# Patient Record
Sex: Female | Born: 1975
Health system: Southern US, Community
[De-identification: ages and names within clinical notes are randomized; demographics above are authoritative.]

## PROBLEM LIST (undated history)

## (undated) DIAGNOSIS — I10 Essential (primary) hypertension: Secondary | ICD-10-CM

## (undated) DIAGNOSIS — M3500 Sicca syndrome, unspecified: Secondary | ICD-10-CM

## (undated) HISTORY — DX: Essential (primary) hypertension: I10

## (undated) HISTORY — DX: Sjogren syndrome, unspecified: M35.00

## (undated) HISTORY — PX: ABDOMINAL HYSTERECTOMY: SHX81

---

## 2019-07-25 ENCOUNTER — Ambulatory Visit: Payer: BC Managed Care – PPO | Admitting: Registered Nurse

## 2019-07-25 ENCOUNTER — Other Ambulatory Visit: Payer: Self-pay

## 2019-07-25 ENCOUNTER — Encounter: Payer: Self-pay | Admitting: Registered Nurse

## 2019-07-25 VITALS — BP 178/125 | HR 90 | Temp 98.1°F | Resp 17 | Ht 65.0 in | Wt 242.8 lb

## 2019-07-25 DIAGNOSIS — Z8669 Personal history of other diseases of the nervous system and sense organs: Secondary | ICD-10-CM

## 2019-07-25 DIAGNOSIS — Z130 Encounter for screening for diseases of the blood and blood-forming organs and certain disorders involving the immune mechanism: Secondary | ICD-10-CM

## 2019-07-25 DIAGNOSIS — Z13228 Encounter for screening for other metabolic disorders: Secondary | ICD-10-CM

## 2019-07-25 DIAGNOSIS — Z13220 Encounter for screening for lipoid disorders: Secondary | ICD-10-CM | POA: Diagnosis not present

## 2019-07-25 DIAGNOSIS — Z1329 Encounter for screening for other suspected endocrine disorder: Secondary | ICD-10-CM | POA: Diagnosis not present

## 2019-07-25 DIAGNOSIS — I10 Essential (primary) hypertension: Secondary | ICD-10-CM

## 2019-07-25 DIAGNOSIS — G8929 Other chronic pain: Secondary | ICD-10-CM

## 2019-07-25 DIAGNOSIS — R252 Cramp and spasm: Secondary | ICD-10-CM

## 2019-07-25 MED ORDER — LOSARTAN POTASSIUM 100 MG PO TABS: 100.0000 mg | ORAL_TABLET | Freq: Every day | ORAL | 0 refills | Status: DC

## 2019-07-25 MED ORDER — NEBIVOLOL HCL 10 MG PO TABS: 10.0000 mg | ORAL_TABLET | Freq: Every day | ORAL | 0 refills | Status: DC

## 2019-07-25 MED ORDER — CYCLOBENZAPRINE HCL 10 MG PO TABS: 10.0000 mg | ORAL_TABLET | Freq: Three times a day (TID) | ORAL | 0 refills | Status: DC | PRN

## 2019-07-25 MED ORDER — TOPIRAMATE 100 MG PO TABS: 100.0000 mg | ORAL_TABLET | Freq: Two times a day (BID) | ORAL | 0 refills | Status: DC

## 2019-07-25 MED ORDER — AMLODIPINE BESYLATE 5 MG PO TABS: 5.0000 mg | ORAL_TABLET | Freq: Every day | ORAL | 0 refills | Status: DC

## 2019-07-25 MED ORDER — GABAPENTIN 300 MG PO CAPS: 300.0000 mg | ORAL_CAPSULE | Freq: Three times a day (TID) | ORAL | 0 refills | Status: DC

## 2019-07-25 NOTE — Patient Instructions (Addendum)
Ms Adelma Bowdoin to meet you and your husband today. I'm looking forward to working with you.  I have refilled your gabapentin, flexeril, and topiramate. I'm hoping these will help with your pain.   For your blood pressure:  Start losartan this week.  Start amlodipine next week.  Start nebivolol the week after.   We have collected the following labs today: CBC (blood counts), CMP (liver and kidney function, electrolytes), TSH (thyroid), A1c (sugars), and Lipid panel (cholesterol), as well as checking Vitamin D, B12, and Magnesium levels. I'll let you know about these results as soon as I can.    I would like to see you again in about 3 weeks to double check on your blood pressure and pain. Please let me know if you have any concerns in the mean time.  Thank you,   Jari Sportsman, NP    If you have lab work done today you will be contacted with your lab results within the next 2 weeks.  If you have not heard from [place] then please contact us. The fastest way to get your results is to register for My Chart.   IF you received an x-ray today, you will receive an invoice from [facility], [facility], [facility]. Please contact [facility] at [phone] with questions or concerns regarding your invoice.   IF you received labwork today, you will receive an invoice from [place]. Please contact [facility] at [phone] with questions or concerns regarding your invoice.   Our billing staff will not be able to assist you with questions regarding bills from these companies.  You will be contacted with the lab results as soon as they are available. The fastest way to get your results is to activate your My Chart account. Instructions are located on the last page of this paperwork. If you have not heard from [place] regarding the results in 2 weeks, please contact this office.

## 2019-07-26 LAB — CBC WITH DIFFERENTIAL/PLATELET
Basophils Absolute: 0 10*3/uL (ref 0.0–0.2)
Basos: 1 %
EOS (ABSOLUTE): 0.1 10*3/uL (ref 0.0–0.4)
Eos: 1 %
Hematocrit: 41.8 % (ref 34.0–46.6)
Hemoglobin: 13.5 g/dL (ref 11.1–15.9)
Immature Grans (Abs): 0 10*3/uL (ref 0.0–0.1)
Immature Granulocytes: 0 %
Lymphocytes Absolute: 1.8 10*3/uL (ref 0.7–3.1)
Lymphs: 25 %
MCH: 27.9 pg (ref 26.6–33.0)
MCHC: 32.3 g/dL (ref 31.5–35.7)
MCV: 86 fL (ref 79–97)
Monocytes Absolute: 0.3 10*3/uL (ref 0.1–0.9)
Monocytes: 4 %
Neutrophils Absolute: 5 10*3/uL (ref 1.4–7.0)
Neutrophils: 69 %
Platelets: 265 10*3/uL (ref 150–450)
RBC: 4.84 x10E6/uL (ref 3.77–5.28)
RDW: 13.7 % (ref 11.7–15.4)
WBC: 7.3 10*3/uL (ref 3.4–10.8)

## 2019-07-26 LAB — COMPREHENSIVE METABOLIC PANEL
ALT: 80 IU/L — ABNORMAL HIGH (ref 0–32)
AST: 62 IU/L — ABNORMAL HIGH (ref 0–40)
Albumin/Globulin Ratio: 1.3 (ref 1.2–2.2)
Albumin: 4.2 g/dL (ref 3.8–4.8)
Alkaline Phosphatase: 85 IU/L (ref 48–121)
BUN/Creatinine Ratio: 10 (ref 9–23)
BUN: 9 mg/dL (ref 6–24)
Bilirubin Total: 0.6 mg/dL (ref 0.0–1.2)
CO2: 25 mmol/L (ref 20–29)
Calcium: 9 mg/dL (ref 8.7–10.2)
Chloride: 100 mmol/L (ref 96–106)
Creatinine, Ser: 0.93 mg/dL (ref 0.57–1.00)
GFR calc Af Amer: 87 mL/min/{1.73_m2} (ref >59)
GFR calc non Af Amer: 76 mL/min/{1.73_m2} (ref >59)
Globulin, Total: 3.2 g/dL (ref 1.5–4.5)
Glucose: 96 mg/dL (ref 65–99)
Potassium: 4.3 mmol/L (ref 3.5–5.2)
Sodium: 138 mmol/L (ref 134–144)
Total Protein: 7.4 g/dL (ref 6.0–8.5)

## 2019-07-26 LAB — LIPID PANEL
Chol/HDL Ratio: 3.4 ratio (ref 0.0–4.4)
Cholesterol, Total: 155 mg/dL (ref 100–199)
HDL: 46 mg/dL (ref >39)
LDL Chol Calc (NIH): 83 mg/dL (ref 0–99)
Triglycerides: 151 mg/dL — ABNORMAL HIGH (ref 0–149)
VLDL Cholesterol Cal: 26 mg/dL (ref 5–40)

## 2019-07-26 LAB — VITAMIN B12: Vitamin B-12: 470 pg/mL (ref 232–1245)

## 2019-07-26 LAB — HEMOGLOBIN A1C
Est. average glucose Bld gHb Est-mCnc: 128 mg/dL
Hgb A1c MFr Bld: 6.1 % — ABNORMAL HIGH (ref 4.8–5.6)

## 2019-07-26 LAB — VITAMIN D 25 HYDROXY (VIT D DEFICIENCY, FRACTURES): Vit D, 25-Hydroxy: 10.5 ng/mL — ABNORMAL LOW (ref 30.0–100.0)

## 2019-07-26 LAB — TSH: TSH: 1.84 u[IU]/mL (ref 0.450–4.500)

## 2019-07-26 LAB — MAGNESIUM: Magnesium: 1.9 mg/dL (ref 1.6–2.3)

## 2019-08-05 ENCOUNTER — Other Ambulatory Visit: Payer: Self-pay | Admitting: Registered Nurse

## 2019-08-05 DIAGNOSIS — E559 Vitamin D deficiency, unspecified: Secondary | ICD-10-CM | POA: Insufficient documentation

## 2019-08-05 MED ORDER — VITAMIN D (ERGOCALCIFEROL) 1.25 MG (50000 UNIT) PO CAPS: 50000.0000 [IU] | ORAL_CAPSULE | ORAL | 0 refills | Status: DC

## 2019-08-05 NOTE — Progress Notes (Signed)
Ms Butkiewicz needs to start on a Vitamin D supplement of 50,000 units once weekly for the next 8 weeks. Let's recheck levels after that. I'm sure this is contributing to her symptoms.  If we could call her to let her know, that would be awesome.  Thanks,   Jari Sportsman, NP

## 2019-08-06 ENCOUNTER — Telehealth: Payer: Self-pay | Admitting: Registered Nurse

## 2019-08-06 NOTE — Telephone Encounter (Signed)
Attempted to call, R. Kateri Plummer does not recommend any specific B-12 however I do not see where she was recommended B-12 I do see where Kateri Plummer ordered high dose Vitamin D 50, 000 Units once weekly. Please clarify and ensure that this is not what they are talking about or transfer to myself.

## 2019-08-06 NOTE — Telephone Encounter (Signed)
Pt husband called asking what kind of b12 pt should buy. Pt would like a call to help assist. Please advise.

## 2019-08-07 ENCOUNTER — Telehealth: Payer: Self-pay | Admitting: Registered Nurse

## 2019-08-07 NOTE — Telephone Encounter (Signed)
Left message with patient's husband who is listed on HIPPA form ,clarifications for Vitamin levels and drs lab result notes.

## 2019-08-07 NOTE — Telephone Encounter (Signed)
Pt is calling back needing clarification if B-12 is needed if was ordered does she need it.

## 2019-08-07 NOTE — Telephone Encounter (Signed)
LVM for patient to call back to make she has clarification on drs recommendations regarding vitamins.

## 2019-08-15 ENCOUNTER — Ambulatory Visit: Payer: BC Managed Care – PPO | Admitting: Registered Nurse

## 2019-08-17 ENCOUNTER — Ambulatory Visit: Payer: BC Managed Care – PPO | Admitting: Registered Nurse

## 2019-08-17 ENCOUNTER — Encounter: Payer: Self-pay | Admitting: Registered Nurse

## 2019-08-17 ENCOUNTER — Other Ambulatory Visit: Payer: Self-pay

## 2019-08-17 VITALS — BP 156/99 | HR 98 | Temp 98.1°F | Ht 65.0 in | Wt 245.0 lb

## 2019-08-17 DIAGNOSIS — G8929 Other chronic pain: Secondary | ICD-10-CM

## 2019-08-17 DIAGNOSIS — I10 Essential (primary) hypertension: Secondary | ICD-10-CM | POA: Diagnosis not present

## 2019-08-17 MED ORDER — METOPROLOL SUCCINATE ER 50 MG PO TB24: 50.0000 mg | ORAL_TABLET | Freq: Every day | ORAL | 3 refills | Status: AC

## 2019-08-17 MED ORDER — PREDNISONE 20 MG PO TABS: 20.0000 mg | ORAL_TABLET | Freq: Every day | ORAL | 0 refills | Status: DC

## 2019-08-17 NOTE — Patient Instructions (Signed)
° ° ° °  If you have lab work done today you will be contacted with your lab results within the next 2 weeks.  If you have not heard from us then please contact us. The fastest way to get your results is to register for My Chart. ° ° °IF you received an x-ray today, you will receive an invoice from [facility]. Please contact [facility] at [phone] with questions or concerns regarding your invoice.  ° °IF you received labwork today, you will receive an invoice from [facility]. Please contact [facility] at [phone] with questions or concerns regarding your invoice.  ° °Our billing staff will not be able to assist you with questions regarding bills from these companies. ° °You will be contacted with the lab results as soon as they are available. The fastest way to get your results is to activate your My Chart account. Instructions are located on the last page of this paperwork. If you have not heard from us regarding the results in 2 weeks, please contact this office. °  ° ° ° °

## 2019-08-18 LAB — SEDIMENTATION RATE: Sed Rate: 26 mm/hr (ref 0–32)

## 2019-08-18 LAB — C-REACTIVE PROTEIN: CRP: 7 mg/L (ref 0–10)

## 2019-08-18 LAB — ANA: Anti Nuclear Antibody (ANA): POSITIVE — AB

## 2019-08-19 ENCOUNTER — Other Ambulatory Visit: Payer: Self-pay | Admitting: Registered Nurse

## 2019-08-19 DIAGNOSIS — R768 Other specified abnormal immunological findings in serum: Secondary | ICD-10-CM

## 2019-08-19 DIAGNOSIS — G8929 Other chronic pain: Secondary | ICD-10-CM

## 2019-08-21 ENCOUNTER — Other Ambulatory Visit: Payer: Self-pay | Admitting: Registered Nurse

## 2019-08-21 DIAGNOSIS — G8929 Other chronic pain: Secondary | ICD-10-CM

## 2019-08-21 DIAGNOSIS — Z8669 Personal history of other diseases of the nervous system and sense organs: Secondary | ICD-10-CM

## 2019-08-21 DIAGNOSIS — I10 Essential (primary) hypertension: Secondary | ICD-10-CM

## 2019-08-21 NOTE — Telephone Encounter (Signed)
Patient is requesting to 90 day supply on this medication is this ok to do?

## 2019-08-23 ENCOUNTER — Telehealth: Payer: Self-pay | Admitting: Registered Nurse

## 2019-08-23 NOTE — Telephone Encounter (Signed)
Patient husband wanted a clearer understanding of msg patient's wife received. Patient husband was given the same msg that Kateri Plummer had stated for [place] to given. Patient husband wanted to know what do it mean to have a positive ANA. He was informed that its inflammation in patient' body where her body is fighting against her own body tissue. SO we put a referral in to the Rheumatologist so they can run further tests. It could be lupus, it could be Rheumatoid arthritis but we have no way of knowing what is her body fighting against and that why we want to send her to a specialist.

## 2019-08-23 NOTE — Telephone Encounter (Signed)
7/29-2021 - PATIENT'S HUSBAND (LACY COLON) CALLED TO SAY BRIAN CALLED HIS WIFE ON Monday 08/20/19 TO GIVE HER SOME LAB RESULTS FROM [facility]. SHE DID NOT UNDERSTAND WHAT HE WAS SAYING THAT SHE WAS POSITIVE FOR? HE WOULD LIKE SOMEONE TO EXPLAIN IT TO HIM. MR. COLON IS ON THE PATIENT'S HIPAA FOR 2021. BEST PHONE [phone] (CELL) MBC

## 2019-08-28 ENCOUNTER — Other Ambulatory Visit: Payer: Self-pay | Admitting: Emergency Medicine

## 2019-08-28 ENCOUNTER — Other Ambulatory Visit: Payer: Self-pay | Admitting: Registered Nurse

## 2019-08-28 DIAGNOSIS — E559 Vitamin D deficiency, unspecified: Secondary | ICD-10-CM

## 2019-08-28 DIAGNOSIS — I10 Essential (primary) hypertension: Secondary | ICD-10-CM

## 2019-10-21 ENCOUNTER — Encounter: Payer: Self-pay | Admitting: Registered Nurse

## 2019-10-21 NOTE — Progress Notes (Signed)
New Patient Office Visit  Subjective:  Patient ID: Sheila Duran, female    DOB: 1975/04/18  Age: 44 y.o. MRN: [id number]  CC:  Chief Complaint  Patient presents with   New Patient (Initial Visit)    patient states she has been having pain on her whole right side including her back for a year but since last week it has got servere. Per patient she has not taken anything for pain   Hypertension    178/125    HPI Sheila Duran presents for pain  R side including R arm, leg, and torso. Sharp pain. Waxes and wanes. No injury, no hx of this pain. OTCs not effective.   Also notes hx of htn. bp very elevated today. Denies CV symptoms. Feeling well overall. Has been medicated in the past, but not currently on medications  Past Medical History:  Diagnosis Date   Hypertension     Past Surgical History:  Procedure Laterality Date   ABDOMINAL HYSTERECTOMY     CESAREAN SECTION      Family History  Problem Relation Age of Onset   Diabetes Mother    Hypertension Mother    Kidney failure Mother    Hypertension Father    Healthy Sister    Healthy Brother    Healthy Daughter    Healthy Son     Social History   Socioeconomic History   Marital status: Married    Spouse name: Not on file   Number of children: 3   Years of education: Not on file   Highest education level: Not on file  Occupational History   Not on file  Tobacco Use   Smoking status: Never Smoker   Smokeless tobacco: Never Used  Building services engineer Use: Never used  Substance and Sexual Activity   Alcohol use: Yes    Comment: rare   Drug use: Never   Sexual activity: Yes  Other Topics Concern   Not on file  Social History Narrative   Not on file   Social Determinants of Health   Financial Resource Strain:    Difficulty of Paying Living Expenses: Not on file  Food Insecurity:    Worried About Programme researcher, broadcasting/film/video in the Last Year: Not on file   [employer] of Food in the Last  Year: Not on file  Transportation Needs:    Lack of Transportation (Medical): Not on file   Lack of Transportation (Non-Medical): Not on file  Physical Activity:    Days of Exercise per Week: Not on file   Minutes of Exercise per Session: Not on file  Stress:    Feeling of Stress : Not on file  Social Connections:    Frequency of Communication with Friends and Family: Not on file   Frequency of Social Gatherings with Friends and Family: Not on file   Attends Religious Services: Not on file   Active Member of Clubs or Organizations: Not on file   Attends Banker Meetings: Not on file   Marital Status: Not on file  Intimate Partner Violence:    Fear of Current or Ex-Partner: Not on file   Emotionally Abused: Not on file   Physically Abused: Not on file   Sexually Abused: Not on file    ROS Review of Systems  Constitutional: Negative.   HENT: Negative.   Eyes: Negative.   Respiratory: Negative.   Cardiovascular: Negative.   Gastrointestinal: Negative.   Genitourinary: Negative.   Musculoskeletal:  Negative.   Skin: Negative.   Neurological: Negative.   Psychiatric/Behavioral: Negative.     Objective:   Today's Vitals: BP (!) 178/125    Pulse 90    Temp 98.1 F (36.7 C) (Temporal)    Resp 17    Ht 5\' 5"  (1.651 m)    Wt 242 lb 12.8 oz (110.1 kg)    SpO2 97%    BMI 40.40 kg/m   Physical Exam Vitals and nursing note reviewed.  Constitutional:      General: She is not in acute distress.    Appearance: Normal appearance. She is obese. She is not ill-appearing, toxic-appearing or diaphoretic.  Cardiovascular:     Pulses: Normal pulses.     Heart sounds: Normal heart sounds. No murmur heard.  No friction rub. No gallop.   Pulmonary:     Effort: Pulmonary effort is normal. No respiratory distress.     Breath sounds: Normal breath sounds. No stridor. No wheezing, rhonchi or rales.  Chest:     Chest wall: No tenderness.  Skin:    Capillary  Refill: Capillary refill takes less than 2 seconds.  Neurological:     General: No focal deficit present.     Mental Status: She is alert and oriented to person, place, and time. Mental status is at baseline.  Psychiatric:        Mood and Affect: Mood normal.        Behavior: Behavior normal.        Thought Content: Thought content normal.        Judgment: Judgment normal.     Assessment & Plan:   Problem List Items Addressed This Visit    None    Visit Diagnoses    Other chronic pain    -  Primary   Relevant Medications   nabumetone (RELAFEN) 500 MG tablet   cyclobenzaprine (FLEXERIL) 10 MG tablet   Other Relevant Orders   Vitamin D, 25-hydroxy (Completed)   Vitamin B12 (Completed)   Magnesium (Completed)   Cramp of limb       Relevant Orders   Vitamin D, 25-hydroxy (Completed)   Vitamin B12 (Completed)   Magnesium (Completed)   Screening for endocrine, metabolic and immunity disorder       Relevant Orders   TSH (Completed)   Comprehensive metabolic panel (Completed)   CBC with Differential (Completed)   Hemoglobin A1c (Completed)   Lipid screening       Relevant Orders   Lipid panel (Completed)   Hx of migraine headaches       Essential hypertension       Relevant Medications   spironolactone (ALDACTONE) 100 MG tablet   nebivolol (BYSTOLIC) 10 MG tablet      Outpatient Encounter Medications as of 07/25/2019  Medication Sig   cyclobenzaprine (FLEXERIL) 10 MG tablet Take 1 tablet (10 mg total) by mouth 3 (three) times daily as needed for muscle spasms.   Iron-Vitamin C 65-125 MG TABS Take by mouth.   nabumetone (RELAFEN) 500 MG tablet Take 500 mg by mouth daily. (Patient not taking: Reported on 08/17/2019)   nebivolol (BYSTOLIC) 10 MG tablet Take 1 tablet (10 mg total) by mouth daily. (Patient not taking: Reported on 08/17/2019)   spironolactone (ALDACTONE) 100 MG tablet Take 100 mg by mouth daily. (Patient not taking: Reported on 08/17/2019)   [DISCONTINUED]  amLODipine (NORVASC) 5 MG tablet Take 5 mg by mouth daily.   [DISCONTINUED] amLODipine (NORVASC) 5 MG tablet Take 1 tablet (  5 mg total) by mouth daily.   [DISCONTINUED] cyclobenzaprine (FLEXERIL) 10 MG tablet Take 10 mg by mouth 3 (three) times daily as needed for muscle spasms.   [DISCONTINUED] gabapentin (NEURONTIN) 300 MG capsule Take 300 mg by mouth 3 (three) times daily.   [DISCONTINUED] gabapentin (NEURONTIN) 300 MG capsule Take 1 capsule (300 mg total) by mouth 3 (three) times daily.   [DISCONTINUED] losartan (COZAAR) 100 MG tablet Take 100 mg by mouth daily.   [DISCONTINUED] losartan (COZAAR) 100 MG tablet Take 1 tablet (100 mg total) by mouth daily.   [DISCONTINUED] nebivolol (BYSTOLIC) 10 MG tablet Take 10 mg by mouth daily.   [DISCONTINUED] topiramate (TOPAMAX) 100 MG tablet Take 100 mg by mouth 2 (two) times daily.   [DISCONTINUED] topiramate (TOPAMAX) 100 MG tablet Take 1 tablet (100 mg total) by mouth 2 (two) times daily.   No facility-administered encounter medications on file as of 07/25/2019.    Follow-up: No follow-ups on file.   PLAN  relafen for pain.  Labs collected, will follow up as warranted  Will start losartan 100mg  PO qd, in one week add nebivolol 10mg  PO qd, return in 2 weeks for nurse visit bp check  Will follow up based on bp and pain experience going forward  Patient encouraged to call clinic with any questions, comments, or concerns.  , NP

## 2019-10-23 ENCOUNTER — Encounter: Payer: Self-pay | Admitting: Registered Nurse

## 2019-10-23 NOTE — Progress Notes (Signed)
Established Patient Office Visit  Subjective:  Patient ID: Sheila Duran, female    DOB: 1975/05/02  Age: 44 y.o. MRN: [id number]  CC:  Chief Complaint  Patient presents with  . right hip and leg pain    folow up pain not better   . Medication Management    cannot pidk up nibivolol , $100    HPI [name] presents for ongoing pain Could not afford nebivolol  Unfortunately no improvement in pain despite attempted treatment.  No change in quality No new symptoms  Past Medical History:  Diagnosis Date  . Hypertension     Past Surgical History:  Procedure Laterality Date  . ABDOMINAL HYSTERECTOMY    . CESAREAN SECTION      Family History  Problem Relation Age of Onset  . Diabetes Mother   . Hypertension Mother   . Kidney failure Mother   . Hypertension Father   . Healthy Sister   . Healthy Brother   . Healthy Daughter   . Healthy Son     Social History   Socioeconomic History  . Marital status: Married    Spouse name: Not on file  . Number of children: 3  . Years of education: Not on file  . Highest education level: Not on file  Occupational History  . Not on file  Tobacco Use  . Smoking status: Never Smoker  . Smokeless tobacco: Never Used  Vaping Use  . Vaping Use: Never used  Substance and Sexual Activity  . Alcohol use: Yes    Comment: rare  . Drug use: Never  . Sexual activity: Yes  Other Topics Concern  . Not on file  Social History Narrative  . Not on file   Social Determinants of Health   Financial Resource Strain:   . Difficulty of Paying Living Expenses: Not on file  Food Insecurity:   . Worried About Charity fundraiser in the Last Year: Not on file  . Ran Out of Food in the Last Year: Not on file  Transportation Needs:   . Lack of Transportation (Medical): Not on file  . Lack of Transportation (Non-Medical): Not on file  Physical Activity:   . Days of Exercise per Week: Not on file  . Minutes of Exercise per Session: Not  on file  Stress:   . Feeling of Stress : Not on file  Social Connections:   . Frequency of Communication with Friends and Family: Not on file  . Frequency of Social Gatherings with Friends and Family: Not on file  . Attends Religious Services: Not on file  . Active Member of Clubs or Organizations: Not on file  . Attends Archivist Meetings: Not on file  . Marital Status: Not on file  Intimate Partner Violence:   . Fear of Current or Ex-Partner: Not on file  . Emotionally Abused: Not on file  . Physically Abused: Not on file  . Sexually Abused: Not on file    Outpatient Medications Prior to Visit  Medication Sig Dispense Refill  . cyclobenzaprine (FLEXERIL) 10 MG tablet Take 1 tablet (10 mg total) by mouth 3 (three) times daily as needed for muscle spasms. 90 tablet 0  . Iron-Vitamin C 65-125 MG TABS Take by mouth.    Marland Kitchen amLODipine (NORVASC) 5 MG tablet Take 1 tablet (5 mg total) by mouth daily. 90 tablet 0  . gabapentin (NEURONTIN) 300 MG capsule Take 1 capsule (300 mg total) by mouth 3 (three) times  daily. 270 capsule 0  . losartan (COZAAR) 100 MG tablet Take 1 tablet (100 mg total) by mouth daily. 90 tablet 0  . topiramate (TOPAMAX) 100 MG tablet Take 1 tablet (100 mg total) by mouth 2 (two) times daily. 180 tablet 0  . Vitamin D, Ergocalciferol, (DRISDOL) 1.25 MG (50000 UNIT) CAPS capsule Take 1 capsule (50,000 Units total) by mouth every 7 (seven) days. 8 capsule 0  . nabumetone (RELAFEN) 500 MG tablet Take 500 mg by mouth daily. (Patient not taking: Reported on 08/17/2019)    . nebivolol (BYSTOLIC) 10 MG tablet Take 1 tablet (10 mg total) by mouth daily. (Patient not taking: Reported on 08/17/2019) 90 tablet 0  . spironolactone (ALDACTONE) 100 MG tablet Take 100 mg by mouth daily. (Patient not taking: Reported on 08/17/2019)     No facility-administered medications prior to visit.    Allergies  Allergen Reactions  . Lisinopril-Hydrochlorothiazide Cough    And chest  pain   . Amoxicillin Rash    ROS Review of Systems  Constitutional: Negative.   HENT: Negative.   Eyes: Negative.   Respiratory: Negative.   Cardiovascular: Negative.   Gastrointestinal: Negative.   Genitourinary: Negative.   Musculoskeletal: Negative.   Skin: Negative.   Neurological: Negative.   Psychiatric/Behavioral: Negative.       Objective:    Physical Exam Vitals and nursing note reviewed.  Constitutional:      General: She is not in acute distress.    Appearance: Normal appearance. She is normal weight. She is not ill-appearing, toxic-appearing or diaphoretic.  Cardiovascular:     Rate and Rhythm: Normal rate and regular rhythm.     Heart sounds: Normal heart sounds. No murmur heard.  No friction rub. No gallop.   Pulmonary:     Effort: Pulmonary effort is normal. No respiratory distress.     Breath sounds: Normal breath sounds. No stridor. No wheezing, rhonchi or rales.  Chest:     Chest wall: No tenderness.  Skin:    General: Skin is warm and dry.  Neurological:     General: No focal deficit present.     Mental Status: She is alert and oriented to person, place, and time. Mental status is at baseline.  Psychiatric:        Mood and Affect: Mood normal.        Behavior: Behavior normal.        Thought Content: Thought content normal.        Judgment: Judgment normal.     BP (!) 156/99   Pulse 98   Temp 98.1 F (36.7 C)   Ht '5\' 5"'  (1.651 m)   Wt (!) 245 lb (111.1 kg)   SpO2 100%   BMI 40.77 kg/m  Wt Readings from Last 3 Encounters:  08/17/19 (!) 245 lb (111.1 kg)  07/25/19 242 lb 12.8 oz (110.1 kg)     Health Maintenance Due  Topic Date Due  . COVID-19 Vaccine (1) Never done  . PAP SMEAR-Modifier  Never done  . INFLUENZA VACCINE  Never done    There are no preventive care reminders to display for this patient.  Lab Results  Component Value Date   TSH 1.840 07/25/2019   Lab Results  Component Value Date   WBC 7.3 07/25/2019   HGB  13.5 07/25/2019   HCT 41.8 07/25/2019   MCV 86 07/25/2019   PLT 265 07/25/2019   Lab Results  Component Value Date   NA 138 07/25/2019  K 4.3 07/25/2019   CO2 25 07/25/2019   GLUCOSE 96 07/25/2019   BUN 9 07/25/2019   CREATININE 0.93 07/25/2019   BILITOT 0.6 07/25/2019   ALKPHOS 85 07/25/2019   AST 62 (H) 07/25/2019   ALT 80 (H) 07/25/2019   PROT 7.4 07/25/2019   ALBUMIN 4.2 07/25/2019   CALCIUM 9.0 07/25/2019   Lab Results  Component Value Date   CHOL 155 07/25/2019   Lab Results  Component Value Date   HDL 46 07/25/2019   Lab Results  Component Value Date   LDLCALC 83 07/25/2019   Lab Results  Component Value Date   TRIG 151 (H) 07/25/2019   Lab Results  Component Value Date   CHOLHDL 3.4 07/25/2019   Lab Results  Component Value Date   HGBA1C 6.1 (H) 07/25/2019      Assessment & Plan:   Problem List Items Addressed This Visit    None    Visit Diagnoses    Essential hypertension    -  Primary   Relevant Medications   metoprolol succinate (TOPROL-XL) 50 MG 24 hr tablet   Other chronic pain       Relevant Medications   predniSONE (DELTASONE) 20 MG tablet   Other Relevant Orders   ANA (Completed)   Sedimentation Rate (Completed)   C-reactive protein (Completed)      Meds ordered this encounter  Medications  . metoprolol succinate (TOPROL-XL) 50 MG 24 hr tablet    Sig: Take 1 tablet (50 mg total) by mouth daily. Take with or immediately following a meal.    Dispense:  90 tablet    Refill:  3    Order Specific Question:   Supervising Provider    Answer:   Carlota Raspberry, JEFFREY R [[id number]]  . predniSONE (DELTASONE) 20 MG tablet    Sig: Take 1 tablet (20 mg total) by mouth daily with breakfast.    Dispense:  4 tablet    Refill:  0    Order Specific Question:   Supervising Provider    Answer:   Carlota Raspberry, JEFFREY R [[id number]]    Follow-up: No follow-ups on file.   PLAN  Will start metoprolol 58m 24h PO qd. Return in 2 weeks for nurse visit bp  check  Prednisone 232mPO qd for 4 days  Drawing ana, crp, esr for investigation of autoimmune or inflammatory process  Patient encouraged to call clinic with any questions, comments, or concerns.  RiMaximiano CossNP

## 2019-10-27 ENCOUNTER — Other Ambulatory Visit: Payer: Self-pay | Admitting: Registered Nurse

## 2019-10-27 DIAGNOSIS — E559 Vitamin D deficiency, unspecified: Secondary | ICD-10-CM

## 2019-10-27 NOTE — Telephone Encounter (Signed)
Requested medication (s) are due for refill today: yes  Requested medication (s) are on the active medication list: yes  Last refill:  08/28/19  Future visit scheduled: no  Notes to clinic:  med not delegated to NT to RF   Requested Prescriptions  Pending Prescriptions Disp Refills   Vitamin D, Ergocalciferol, (DRISDOL) 1.25 MG (50000 UNIT) CAPS capsule [Pharmacy Med Name: VITAMIN D2 1.25MG (50,000 UNIT)] 4 capsule 1    Sig: TAKE 1 CAPSULE (50,000 UNITS TOTAL) BY MOUTH EVERY 7 (SEVEN) DAYS.      Endocrinology:  Vitamins - Vitamin D Supplementation Failed - 10/27/2019 12:44 AM      Failed - 50,000 IU strengths are not delegated      Failed - Phosphate in normal range and within 360 days    No results found for: PHOS        Failed - Vitamin D in normal range and within 360 days    Vit D, 25-Hydroxy  Date Value Ref Range Status  07/25/2019 10.5 (L) 30.0 - 100.0 ng/mL Final    Comment:    Vitamin D deficiency has been defined by the Institute of Medicine and an Endocrine Society practice guideline as a level of serum 25-OH vitamin D less than 20 ng/mL (1,2). The Endocrine Society went on to further define vitamin D insufficiency as a level between 21 and 29 ng/mL (2). 1. IOM (Institute of Medicine). 2010. Dietary reference    intakes for calcium and D. [place]: The    [employer]. 2. Holick MF, Binkley [place], Bischoff-Ferrari HA, et al.    Evaluation, treatment, and prevention of vitamin D    deficiency: an Endocrine Society clinical practice    guideline. JCEM. 2011 Jul; 96(7):1911-30.           Passed - Ca in normal range and within 360 days    Calcium  Date Value Ref Range Status  07/25/2019 9.0 8.7 - 10.2 mg/dL Final          Passed - Valid encounter within last 12 months    Recent Outpatient Visits           2 months ago Essential hypertension   [facility], Gerlene Burdock, NP   3 months ago Other chronic pain   [facility], Gerlene Burdock, NP       Future Appointments             In 4 months Pollyann Savoy, MD [facility]

## 2020-01-27 NOTE — Progress Notes (Signed)
Office Visit Note  Patient: Sheila Duran             Date of Birth: January 16, 1976           MRN: [id number]             PCP: Maximiano Coss, NP Referring: Maximiano Coss, NP Visit Date: 01/31/2020 Occupation: _0 @  Subjective:  Lower back pain, positive ANA.   History of Present Illness: Sheila Duran is a 45 y.o. female seen in consultation per request of her PCP.  According to patient she has had history of lower back pain for 15 years.  She still lives in the [place] and moved to [place] 1 year ago.  [place] prednisone pack about once a year and also Naprosyn.  She states only prednisone pack helped her and Naprosyn was not effective.  She was seen by her PCP in July and was given prednisone pack for 3 days which was effective and then she was a started on Neurontin and Flexeril which has not been as helpful.  She states the pain is mostly in her right lower back which radiates down into her right lower extremity.  She also describes pain over right SI joint.  None of the other joints are painful.  She states she has occasional discomfort in her right shoulder joint.  She denies any history of joint swelling.  There is no history of oral ulcers, nasal ulcers, malar rash, photosensitivity, Raynaud's phenomenon, lymphadenopathy.  She had labs done by her PCP which showed positive ANA for that reason she was referred to me.  There is no family history of autoimmune disease.  She is gravida 3, para 3, miscarriages 0.  Activities of Daily Living:  Patient reports morning stiffness for 2-3  hours.   Patient Reports nocturnal pain.  Difficulty dressing/grooming: Denies Difficulty climbing stairs: Reports Difficulty getting out of chair: Denies Difficulty using hands for taps, buttons, cutlery, and/or writing: Denies  Review of Systems  Constitutional: Negative for fatigue, night sweats, weight gain and weight loss.  HENT:  Negative for mouth sores, trouble swallowing, trouble swallowing, mouth dryness and nose dryness.   Eyes: Negative for pain, redness, itching, visual disturbance and dryness.  Respiratory: Negative for cough, shortness of breath and difficulty breathing.   Cardiovascular: Negative for chest pain, palpitations, hypertension, irregular heartbeat and swelling in legs/feet.  Gastrointestinal: Negative for blood in stool, constipation and diarrhea.  Endocrine: Negative for increased urination.  Genitourinary: Negative for difficulty urinating and vaginal dryness.  Musculoskeletal: Positive for arthralgias, joint pain, myalgias, morning stiffness, muscle tenderness and myalgias. Negative for joint swelling and muscle weakness.  Skin: Negative for color change, rash, hair loss, redness, skin tightness, ulcers and sensitivity to sunlight.  Allergic/Immunologic: Negative for susceptible to infections.  Neurological: Positive for dizziness and headaches. Negative for numbness, memory loss, night sweats and weakness.  Hematological: Negative for bruising/bleeding tendency and swollen glands.  Psychiatric/Behavioral: Negative for depressed mood, confusion and sleep disturbance. The patient is not nervous/anxious.     PMFS History:  Patient Active Problem List   Diagnosis Date Noted  . History of migraine 01/31/2020  . Essential hypertension 01/31/2020  . Chronic pain syndrome 01/31/2020    Past Medical History:  Diagnosis Date  . Hypertension     Family History  Problem Relation Age of Onset  . Diabetes Mother   . Hypertension Mother   . Kidney failure Mother   .  Hypertension Father   . Healthy Sister   . Healthy Brother   . Healthy Daughter   . Healthy Son   . Healthy Sister   . Healthy Son    Past Surgical History:  Procedure Laterality Date  . ABDOMINAL HYSTERECTOMY    . CESAREAN SECTION     Social History   Social History Narrative  . Not on file   Immunization History   Administered Date(s) Administered  . Moderna Sars-Covid-2 Vaccination 03/02/2019, 03/30/2019, 01/14/2020     Objective: Vital Signs: BP (!) 170/120 (BP Location: Right Arm, Patient Position: Sitting, Cuff Size: Large)   Pulse 80   Resp 16   Ht _0  (1.626 m)   Wt 252 lb (114.3 kg)   BMI 43.26 kg/m    Physical Exam Vitals and nursing note reviewed.  Constitutional:      Appearance: She is well-developed and well-nourished.  HENT:     Head: Normocephalic and atraumatic.  Eyes:     Extraocular Movements: EOM normal.     Conjunctiva/sclera: Conjunctivae normal.  Cardiovascular:     Rate and Rhythm: Normal rate and regular rhythm.     Pulses: Intact distal pulses.     Heart sounds: Normal heart sounds.  Pulmonary:     Effort: Pulmonary effort is normal.     Breath sounds: Normal breath sounds.  Abdominal:     General: Bowel sounds are normal.     Palpations: Abdomen is soft.  Musculoskeletal:     Cervical back: Normal range of motion.  Lymphadenopathy:     Cervical: No cervical adenopathy.  Skin:    General: Skin is warm and dry.     Capillary Refill: Capillary refill takes less than 2 seconds.  Neurological:     Mental Status: She is alert and oriented to person, place, and time.  Psychiatric:        Mood and Affect: Mood and affect normal.        Behavior: Behavior normal.      Musculoskeletal Exam: C-spine was in good range of motion.  Thoracic and lumbar spine with good range of motion with some discomfort in the lower lumbar region.  She had tenderness on palpation of right SI joint.  There was no tenderness over lumbar spine.  Shoulder joints, elbow joints, wrist joints, MCPs PIPs and DIPs with good range of motion with no synovitis.  Hip joints, knee joints, ankles, MTPs and PIPs with good range of motion with no synovitis.  CDAI Exam: CDAI Score: -- Patient Global: --; Provider Global: -- Swollen: --; Tender: -- Joint Exam 01/31/2020   No joint exam has  been documented for this visit   There is currently no information documented on the homunculus. Go to the Rheumatology activity and complete the homunculus joint exam.  Investigation: No additional findings.  Imaging: XR Lumbar Spine 2-3 Views  Result Date: 01/31/2020 Levoscoliosis was noted.  No significant disc space narrowing was noted.  Anterior spurring was noted.  Facet joint arthropathy was noted. Impression: These findings are consistent with levoscoliosis, mild spondylosis and facet joint arthropathy.  XR Pelvis 1-2 Views  Result Date: 01/31/2020 No SI joint to sclerosis or narrowing was noted.  No hip joint narrowing was noted. Impression: Unremarkable x-ray of the SI joints.   Recent Labs: Lab Results  Component Value Date   WBC 7.3 07/25/2019   HGB 13.5 07/25/2019   PLT 265 07/25/2019   NA 138 07/25/2019   K 4.3 07/25/2019  CL 100 07/25/2019   CO2 25 07/25/2019   GLUCOSE 96 07/25/2019   BUN 9 07/25/2019   CREATININE 0.93 07/25/2019   BILITOT 0.6 07/25/2019   ALKPHOS 85 07/25/2019   AST 62 (H) 07/25/2019   ALT 80 (H) 07/25/2019   PROT 7.4 07/25/2019   ALBUMIN 4.2 07/25/2019   CALCIUM 9.0 07/25/2019   GFRAA 87 07/25/2019    Speciality Comments: No specialty comments available.  Procedures:  No procedures performed Allergies: Lisinopril-hydrochlorothiazide and Amoxicillin   Assessment / Plan:     Visit Diagnoses: Positive ANA (antinuclear antibody) - 08/17/19: ANA+, ESR 26, CRP 7.  Patient has positive ANA but no titer is given.  I will obtain ANA, ENA and C3-C4.  There is no clinical features of autoimmune disease.  There is no history of oral ulcers, nasal ulcers, malar rash, sicca symptoms, Raynaud's phenomenon, photosensitivity or inflammatory arthritis.  Chronic right-sided low back pain with right-sided sciatica -she complains of lower back pain for the last 15 years radiating into her right lower extremity.  [place] prednisone taper and also NSAIDs over the years.  She states the prednisone helps with the NSAIDs are not as effective.  She is currently on anti-inflammatories.  Plan: XR Lumbar Spine 2-3 Views.  X-ray of the lumbar spine showed mild spondylosis, levoscoliosis and facet joint arthropathy.  She will benefit from physical therapy.  Have given her some back exercises.  Chronic right SI joint pain -she had tenderness on palpation over right SI joint.  There was no joint swelling or inflammation in any other joints.  Plan: XR Pelvis 1-2 Views.  No SI joint sclerosis or narrowing was noted.  Chronic pain syndrome - Treated by her PCP with gabapentin and Flexeril.  Vitamin D deficiency-she is on vitamin D.  Essential hypertension-her blood pressure both systolic and diastolic are elevated.  Complications from high blood pressure was discussed.  Have advised her to contact her PCPs office as soon as possible.  If she cannot reach the PCPs office she should go to the emergency room.  Elevated LFTs-her LFTs are elevated most likely due to the NSAID use.  History of migraine-she gives history of frequent migraines for which she has been on Topamax.  Orders: Orders Placed This Encounter  Procedures  . XR Lumbar Spine 2-3 Views  . XR Pelvis 1-2 Views  . COMPLETE METABOLIC PANEL WITH GFR  . Urinalysis, Routine w reflex microscopic  . ANA  . Anti-scleroderma antibody  . RNP Antibody  . Anti-Smith antibody  . Sjogrens syndrome-A extractable nuclear antibody  . Sjogrens syndrome-B extractable nuclear antibody  . Anti-DNA antibody, double-stranded  . C3 and C4  . CK   No orders of the defined types were placed in this encounter.     Follow-Up Instructions: Return for Lower back pain, positive ANA.   Bo Merino, MD  Note - This record has been created using Editor, commissioning.  Chart creation errors have been sought, but may not always  have been located. Such creation  errors do not reflect on  the standard of medical care.

## 2020-01-31 ENCOUNTER — Ambulatory Visit: Payer: Self-pay

## 2020-01-31 ENCOUNTER — Encounter (INDEPENDENT_AMBULATORY_CARE_PROVIDER_SITE_OTHER): Payer: Self-pay

## 2020-01-31 ENCOUNTER — Ambulatory Visit: Payer: BC Managed Care – PPO | Admitting: Rheumatology

## 2020-01-31 ENCOUNTER — Other Ambulatory Visit: Payer: Self-pay

## 2020-01-31 ENCOUNTER — Encounter: Payer: Self-pay | Admitting: Rheumatology

## 2020-01-31 VITALS — BP 170/120 | HR 80 | Resp 16 | Ht 64.0 in | Wt 252.0 lb

## 2020-01-31 DIAGNOSIS — M533 Sacrococcygeal disorders, not elsewhere classified: Secondary | ICD-10-CM

## 2020-01-31 DIAGNOSIS — M5441 Lumbago with sciatica, right side: Secondary | ICD-10-CM | POA: Diagnosis not present

## 2020-01-31 DIAGNOSIS — G894 Chronic pain syndrome: Secondary | ICD-10-CM

## 2020-01-31 DIAGNOSIS — Z8669 Personal history of other diseases of the nervous system and sense organs: Secondary | ICD-10-CM

## 2020-01-31 DIAGNOSIS — R768 Other specified abnormal immunological findings in serum: Secondary | ICD-10-CM

## 2020-01-31 DIAGNOSIS — I10 Essential (primary) hypertension: Secondary | ICD-10-CM

## 2020-01-31 DIAGNOSIS — G8929 Other chronic pain: Secondary | ICD-10-CM

## 2020-01-31 DIAGNOSIS — R7989 Other specified abnormal findings of blood chemistry: Secondary | ICD-10-CM

## 2020-01-31 DIAGNOSIS — E559 Vitamin D deficiency, unspecified: Secondary | ICD-10-CM

## 2020-01-31 NOTE — Patient Instructions (Signed)

## 2020-02-01 ENCOUNTER — Ambulatory Visit: Payer: BC Managed Care – PPO | Admitting: Registered Nurse

## 2020-02-01 ENCOUNTER — Other Ambulatory Visit: Payer: Self-pay

## 2020-02-01 ENCOUNTER — Encounter: Payer: Self-pay | Admitting: Registered Nurse

## 2020-02-01 ENCOUNTER — Other Ambulatory Visit: Payer: Self-pay | Admitting: Registered Nurse

## 2020-02-01 VITALS — BP 170/100 | HR 76 | Temp 98.1°F | Ht 65.0 in | Wt 246.8 lb

## 2020-02-01 DIAGNOSIS — R002 Palpitations: Secondary | ICD-10-CM

## 2020-02-01 DIAGNOSIS — R6889 Other general symptoms and signs: Secondary | ICD-10-CM

## 2020-02-01 DIAGNOSIS — G8929 Other chronic pain: Secondary | ICD-10-CM

## 2020-02-01 DIAGNOSIS — Z8669 Personal history of other diseases of the nervous system and sense organs: Secondary | ICD-10-CM

## 2020-02-01 DIAGNOSIS — I10 Essential (primary) hypertension: Secondary | ICD-10-CM | POA: Diagnosis not present

## 2020-02-01 MED ORDER — AMLODIPINE BESYLATE 10 MG PO TABS: 10.0000 mg | ORAL_TABLET | Freq: Every day | ORAL | 3 refills | Status: AC

## 2020-02-01 NOTE — Progress Notes (Signed)
LFTs are still elevated.  Patient should avoid all anti-inflammatories and Tylenol.  UA shows positive nitrite and bacteria.  She should see her PCP for the evaluation and treatment of UTI.

## 2020-02-01 NOTE — Patient Instructions (Signed)
° ° ° °  If you have lab work done today you will be contacted with your lab results within the next 2 weeks.  If you have not heard from us then please contact us. The fastest way to get your results is to register for My Chart. ° ° °IF you received an x-ray today, you will receive an invoice from [facility]. Please contact  Radiology at [phone] with questions or concerns regarding your invoice.  ° °IF you received labwork today, you will receive an invoice from [facility]. Please contact [facility] at [phone] with questions or concerns regarding your invoice.  ° °Our billing staff will not be able to assist you with questions regarding bills from these companies. ° °You will be contacted with the lab results as soon as they are available. The fastest way to get your results is to activate your My Chart account. Instructions are located on the last page of this paperwork. If you have not heard from us regarding the results in 2 weeks, please contact this office. °  ° ° ° °

## 2020-02-02 LAB — THYROID PANEL WITH TSH
Free Thyroxine Index: 2.2 (ref 1.2–4.9)
T3 Uptake Ratio: 30 % (ref 24–39)
T4, Total: 7.2 ug/dL (ref 4.5–12.0)
TSH: 1.27 u[IU]/mL (ref 0.450–4.500)

## 2020-02-02 LAB — CBC WITH DIFFERENTIAL
Basophils Absolute: 0 10*3/uL (ref 0.0–0.2)
Basos: 0 %
EOS (ABSOLUTE): 0 10*3/uL (ref 0.0–0.4)
Eos: 1 %
Hematocrit: 42.3 % (ref 34.0–46.6)
Hemoglobin: 14.1 g/dL (ref 11.1–15.9)
Immature Grans (Abs): 0 10*3/uL (ref 0.0–0.1)
Immature Granulocytes: 0 %
Lymphocytes Absolute: 1.8 10*3/uL (ref 0.7–3.1)
Lymphs: 30 %
MCH: 29 pg (ref 26.6–33.0)
MCHC: 33.3 g/dL (ref 31.5–35.7)
MCV: 87 fL (ref 79–97)
Monocytes Absolute: 0.3 10*3/uL (ref 0.1–0.9)
Monocytes: 5 %
Neutrophils Absolute: 3.8 10*3/uL (ref 1.4–7.0)
Neutrophils: 64 %
RBC: 4.86 x10E6/uL (ref 3.77–5.28)
RDW: 13 % (ref 11.7–15.4)
WBC: 5.9 10*3/uL (ref 3.4–10.8)

## 2020-02-02 LAB — INSULIN AND C-PEPTIDE, SERUM
C-Peptide: 2.4 ng/mL (ref 1.1–4.4)
INSULIN: 15.5 u[IU]/mL (ref 2.6–24.9)

## 2020-02-02 NOTE — Telephone Encounter (Signed)
Requested Prescriptions  Pending Prescriptions Disp Refills  . topiramate (TOPAMAX) 100 MG tablet [Pharmacy Med Name: TOPIRAMATE 100 MG TABLET] 60 tablet     Sig: TAKE 1 TABLET BY MOUTH TWICE A DAY     Not Delegated - Neurology: Anticonvulsants - topiramate & zonisamide Failed - 02/01/2020  6:32 PM      Failed - This refill cannot be delegated      Passed - Cr in normal range and within 360 days    Creat  Date Value Ref Range Status  01/31/2020 0.94 0.50 - 1.10 mg/dL Final         Passed - CO2 in normal range and within 360 days    CO2  Date Value Ref Range Status  01/31/2020 26 20 - 32 mmol/L Final         Passed - Valid encounter within last 12 months    Recent Outpatient Visits          Yesterday Cold sweat   [facility], Gerlene Burdock, NP   5 months ago Essential hypertension   [facility], Gerlene Burdock, NP   6 months ago Other chronic pain   [facility], Gerlene Burdock, NP      Future Appointments            In 3 weeks Pollyann Savoy, MD [facility]           . amLODipine (NORVASC) 5 MG tablet [Pharmacy Med Name: AMLODIPINE BESYLATE 5 MG TAB] 30 tablet     Sig: TAKE 1 TABLET BY MOUTH EVERY DAY     Cardiovascular:  Calcium Channel Blockers Failed - 02/01/2020  6:32 PM      Failed - Last BP in normal range    BP Readings from Last 1 Encounters:  02/01/20 (!) 170/100         Passed - Valid encounter within last 6 months    Recent Outpatient Visits          Yesterday Cold sweat   [facility], Gerlene Burdock, NP   5 months ago Essential hypertension   [facility], Richard, NP   6 months ago Other chronic pain   [facility], Gerlene Burdock, NP      Future Appointments            In 3 weeks Pollyann Savoy, MD [facility]           Signed Prescriptions Disp Refills   losartan (COZAAR) 100 MG tablet 90 tablet 1    Sig: TAKE 1 TABLET BY MOUTH EVERY  DAY     Cardiovascular:  Angiotensin Receptor Blockers Failed - 02/01/2020  6:32 PM      Failed - Last BP in normal range    BP Readings from Last 1 Encounters:  02/01/20 (!) 170/100         Passed - Cr in normal range and within 180 days    Creat  Date Value Ref Range Status  01/31/2020 0.94 0.50 - 1.10 mg/dL Final         Passed - K in normal range and within 180 days    Potassium  Date Value Ref Range Status  01/31/2020 3.9 3.5 - 5.3 mmol/L Final         Passed - Patient is not pregnant      Passed - Valid encounter within last 6 months    Recent Outpatient  Visits          Yesterday Cold sweat   [facility], Gerlene Burdock, NP   5 months ago Essential hypertension   [facility], Gerlene Burdock, NP   6 months ago Other chronic pain   [facility], Gerlene Burdock, NP      Future Appointments            In 3 weeks Pollyann Savoy, MD [facility]

## 2020-02-02 NOTE — Progress Notes (Signed)
Hello,  Normal results letter, please.  Thanks,  Rich Demetrius Barrell, NP 

## 2020-02-02 NOTE — Telephone Encounter (Signed)
Requested medication (s) are due for refill today: yes  Requested medication (s) are on the active medication list: yes  Last refill:  08/21/19  Future visit scheduled: no  Notes to clinic:  med not delegated to NT to RF   Requested Prescriptions  Pending Prescriptions Disp Refills   topiramate (TOPAMAX) 100 MG tablet [Pharmacy Med Name: TOPIRAMATE 100 MG TABLET] 60 tablet     Sig: TAKE 1 TABLET BY MOUTH TWICE A DAY      Not Delegated - Neurology: Anticonvulsants - topiramate & zonisamide Failed - 02/01/2020  6:32 PM      Failed - This refill cannot be delegated      Passed - Cr in normal range and within 360 days    Creat  Date Value Ref Range Status  01/31/2020 0.94 0.50 - 1.10 mg/dL Final          Passed - CO2 in normal range and within 360 days    CO2  Date Value Ref Range Status  01/31/2020 26 20 - 32 mmol/L Final          Passed - Valid encounter within last 12 months    Recent Outpatient Visits           Yesterday Cold sweat   [facility], Gerlene Burdock, NP   5 months ago Essential hypertension   [facility], Richard, NP   6 months ago Other chronic pain   [facility], Gerlene Burdock, NP       Future Appointments             In 3 weeks Pollyann Savoy, MD [facility]              Signed Prescriptions Disp Refills   losartan (COZAAR) 100 MG tablet 90 tablet 1    Sig: TAKE 1 TABLET BY MOUTH EVERY DAY      Cardiovascular:  Angiotensin Receptor Blockers Failed - 02/01/2020  6:32 PM      Failed - Last BP in normal range    BP Readings from Last 1 Encounters:  02/01/20 (!) 170/100          Passed - Cr in normal range and within 180 days    Creat  Date Value Ref Range Status  01/31/2020 0.94 0.50 - 1.10 mg/dL Final          Passed - K in normal range and within 180 days    Potassium  Date Value Ref Range Status  01/31/2020 3.9 3.5 - 5.3 mmol/L Final          Passed - Patient is not  pregnant      Passed - Valid encounter within last 6 months    Recent Outpatient Visits           Yesterday Cold sweat   [facility], Gerlene Burdock, NP   5 months ago Essential hypertension   [facility], Gerlene Burdock, NP   6 months ago Other chronic pain   [facility], Gerlene Burdock, NP       Future Appointments             In 3 weeks Pollyann Savoy, MD [facility]              Refused Prescriptions Disp Refills   amLODipine (NORVASC) 5 MG tablet [Pharmacy Med Name: AMLODIPINE BESYLATE 5 MG TAB] 30 tablet     Sig:  TAKE 1 TABLET BY MOUTH EVERY DAY      Cardiovascular:  Calcium Channel Blockers Failed - 02/01/2020  6:32 PM      Failed - Last BP in normal range    BP Readings from Last 1 Encounters:  02/01/20 (!) 170/100          Passed - Valid encounter within last 6 months    Recent Outpatient Visits           Yesterday Cold sweat   [facility], Gerlene Burdock, NP   5 months ago Essential hypertension   [facility], Gerlene Burdock, NP   6 months ago Other chronic pain   [facility], Gerlene Burdock, NP       Future Appointments             In 3 weeks Pollyann Savoy, MD [facility]

## 2020-02-03 LAB — ANTI-SMITH ANTIBODY: ENA SM Ab Ser-aCnc: <1 AI

## 2020-02-03 LAB — URINALYSIS, ROUTINE W REFLEX MICROSCOPIC
Bilirubin Urine: NEGATIVE
Glucose, UA: NEGATIVE
Hyaline Cast: NONE SEEN /LPF
Ketones, ur: NEGATIVE
Leukocytes,Ua: NEGATIVE
Nitrite: POSITIVE — AB
Protein, ur: NEGATIVE
RBC / HPF: NONE SEEN /HPF (ref 0–2)
Specific Gravity, Urine: 1.022 (ref 1.001–1.03)
WBC, UA: NONE SEEN /HPF (ref 0–5)
pH: <=5 (ref 5.0–8.0)

## 2020-02-03 LAB — COMPLETE METABOLIC PANEL WITH GFR
AG Ratio: 1.3 (calc) (ref 1.0–2.5)
ALT: 57 U/L — ABNORMAL HIGH (ref 6–29)
AST: 33 U/L — ABNORMAL HIGH (ref 10–30)
Albumin: 4.2 g/dL (ref 3.6–5.1)
Alkaline phosphatase (APISO): 54 U/L (ref 31–125)
BUN: 14 mg/dL (ref 7–25)
CO2: 26 mmol/L (ref 20–32)
Calcium: 9.4 mg/dL (ref 8.6–10.2)
Chloride: 104 mmol/L (ref 98–110)
Creat: 0.94 mg/dL (ref 0.50–1.10)
GFR, Est African American: 86 mL/min/{1.73_m2} (ref >=60)
GFR, Est Non African American: 74 mL/min/{1.73_m2} (ref >=60)
Globulin: 3.2 g/dL (calc) (ref 1.9–3.7)
Glucose, Bld: 105 mg/dL — ABNORMAL HIGH (ref 65–99)
Potassium: 3.9 mmol/L (ref 3.5–5.3)
Sodium: 138 mmol/L (ref 135–146)
Total Bilirubin: 0.5 mg/dL (ref 0.2–1.2)
Total Protein: 7.4 g/dL (ref 6.1–8.1)

## 2020-02-03 LAB — SJOGRENS SYNDROME-B EXTRACTABLE NUCLEAR ANTIBODY: SSB (La) (ENA) Antibody, IgG: <1 AI

## 2020-02-03 LAB — ANA: Anti Nuclear Antibody (ANA): NEGATIVE

## 2020-02-03 LAB — RNP ANTIBODY: Ribonucleic Protein(ENA) Antibody, IgG: <1 AI

## 2020-02-03 LAB — C3 AND C4
C3 Complement: 124 mg/dL (ref 83–193)
C4 Complement: 35 mg/dL (ref 15–57)

## 2020-02-03 LAB — SJOGRENS SYNDROME-A EXTRACTABLE NUCLEAR ANTIBODY: SSA (Ro) (ENA) Antibody, IgG: >8 AI — AB

## 2020-02-03 LAB — ANTI-SCLERODERMA ANTIBODY: Scleroderma (Scl-70) (ENA) Antibody, IgG: <1 AI

## 2020-02-03 LAB — ANTI-DNA ANTIBODY, DOUBLE-STRANDED: ds DNA Ab: 1 IU/mL

## 2020-02-03 LAB — CK: Total CK: 63 U/L (ref 29–143)

## 2020-02-04 NOTE — Progress Notes (Signed)
I will discuss abnormal labs at the follow-up visit.

## 2020-02-05 ENCOUNTER — Telehealth: Payer: Self-pay | Admitting: Registered Nurse

## 2020-02-05 ENCOUNTER — Encounter: Payer: Self-pay | Admitting: Radiology

## 2020-02-05 NOTE — Telephone Encounter (Signed)
Pts husband called stated that she did not revived the anibiotic for pts infection when she came in on 02/01/20. Pt stated this was discussed at appt. Please advise.

## 2020-02-06 NOTE — Telephone Encounter (Signed)
Pt husband is calling again regarding the antibioticR Rx. He stated that it was called zactran? Pt was seen for this on 02/01/20 he stated.  [id number] - [place], [place] - [address]

## 2020-02-07 NOTE — Telephone Encounter (Signed)
Pt needs abx? Please advise

## 2020-02-08 NOTE — Telephone Encounter (Signed)
Pt. Called again looking for script, clarified to be  bactrin

## 2020-02-13 NOTE — Telephone Encounter (Signed)
Pt checking on status of this message.  She really needs Bactrim. Please advise at [phone]

## 2020-02-21 ENCOUNTER — Telehealth: Payer: Self-pay | Admitting: Registered Nurse

## 2020-02-21 NOTE — Telephone Encounter (Signed)
We did not diagnose UTI she will need visit with [place] if she wants [place] to prescribe anything. Keep appt monday

## 2020-02-21 NOTE — Telephone Encounter (Signed)
Received call from patient's husband. Patient diagnosed with UTI but antibiotics never called into pharmacy. Patient scheduled for acute visit on Monday, 1/31. Please advise at [phone].

## 2020-02-23 NOTE — Progress Notes (Signed)
Office Visit Note  Patient: Sheila Duran             Date of Birth: 01/11/1976           MRN: [id number]             PCP: Maximiano Coss, NP Referring: Maximiano Coss, NP Visit Date: 02/28/2020 Occupation: '@GUAROCC' @  Subjective:  Dry eyes and joint pain.   History of Present Illness: Sheila Duran is a 45 y.o. female with history of sicca symptoms and joint pain.  She states she continues to have dry mouth and dry eyes.  She was also experiencing some palpitations at the last visit and had a EKG which was abnormal.  Patient states she was referred to a cardiologist and she is waiting for the appointment.  She continues to have discomfort in her lower back.  Activities of Daily Living:  Patient reports morning stiffness for 2 hours.   Patient Reports nocturnal pain.  Difficulty dressing/grooming: Denies Difficulty climbing stairs: Denies Difficulty getting out of chair: Denies Difficulty using hands for taps, buttons, cutlery, and/or writing: Denies  Review of Systems  Constitutional: Negative for fatigue, night sweats, weight gain and weight loss.  HENT: Positive for mouth dryness. Negative for mouth sores, trouble swallowing, trouble swallowing and nose dryness.   Eyes: Positive for pain and itching. Negative for redness, visual disturbance and dryness.  Respiratory: Negative for cough, hemoptysis, shortness of breath and difficulty breathing.   Cardiovascular: Negative for chest pain, palpitations, hypertension, irregular heartbeat and swelling in legs/feet.  Gastrointestinal: Negative for abdominal pain, blood in stool, constipation and diarrhea.  Endocrine: Negative for increased urination.  Genitourinary: Negative for painful urination and vaginal dryness.  Musculoskeletal: Positive for arthralgias, joint pain and morning stiffness. Negative for joint swelling, myalgias, muscle weakness, muscle tenderness and myalgias.  Skin: Negative for color change, rash, hair loss,  redness, skin tightness, ulcers and sensitivity to sunlight.  Allergic/Immunologic: Negative for susceptible to infections.  Neurological: Positive for dizziness and headaches. Negative for numbness, memory loss, night sweats and weakness.  Hematological: Negative for swollen glands.  Psychiatric/Behavioral: Negative for depressed mood, confusion and sleep disturbance. The patient is not nervous/anxious.     PMFS History:  Patient Active Problem List   Diagnosis Date Noted   History of migraine 01/31/2020   Essential hypertension 01/31/2020   Chronic pain syndrome 01/31/2020    Past Medical History:  Diagnosis Date   Hypertension     Family History  Problem Relation Age of Onset   Diabetes Mother    Hypertension Mother    Kidney failure Mother    Hypertension Father    Healthy Sister    Healthy Brother    Healthy Daughter    Healthy Son    Healthy Sister    Healthy Son    Past Surgical History:  Procedure Laterality Date   ABDOMINAL HYSTERECTOMY     CESAREAN SECTION     Social History   Social History Narrative   Not on file   Immunization History  Administered Date(s) Administered   Moderna Sars-Covid-2 Vaccination 03/02/2019, 03/30/2019, 01/14/2020     Objective: Vital Signs: BP 138/90 (BP Location: Left Arm, Patient Position: Sitting, Cuff Size: Normal)    Pulse 76    Ht '5\' 5"'  (1.651 m)    Wt 249 lb 9.6 oz (113.2 kg)    BMI 41.54 kg/m    Physical Exam Vitals and nursing note reviewed.  Constitutional:      Appearance: She  is well-developed and well-nourished.  HENT:     Head: Normocephalic and atraumatic.  Eyes:     Extraocular Movements: EOM normal.     Conjunctiva/sclera: Conjunctivae normal.  Cardiovascular:     Rate and Rhythm: Normal rate and regular rhythm.     Pulses: Intact distal pulses.     Heart sounds: Normal heart sounds.  Pulmonary:     Effort: Pulmonary effort is normal.     Breath sounds: Normal breath sounds.   Abdominal:     General: Bowel sounds are normal.     Palpations: Abdomen is soft.  Musculoskeletal:     Cervical back: Normal range of motion.  Lymphadenopathy:     Cervical: No cervical adenopathy.  Skin:    General: Skin is warm and dry.     Capillary Refill: Capillary refill takes less than 2 seconds.  Neurological:     Mental Status: She is alert and oriented to person, place, and time.  Psychiatric:        Mood and Affect: Mood and affect normal.        Behavior: Behavior normal.      Musculoskeletal Exam: C-spine was in good range of motion.  She had painful range of motion of her lumbar spine.  Shoulder joints, elbow joints, wrist joints, MCPs PIPs and DIPs with good range of motion with no synovitis.  Hip joints, knee joints, ankles, MTPs and PIPs with good range of motion with no synovitis.  CDAI Exam: CDAI Score: -- Patient Global: --; Provider Global: -- Swollen: --; Tender: -- Joint Exam 02/28/2020   No joint exam has been documented for this visit   There is currently no information documented on the homunculus. Go to the Rheumatology activity and complete the homunculus joint exam.  Investigation: No additional findings.  Imaging: XR Lumbar Spine 2-3 Views  Result Date: 01/31/2020 Levoscoliosis was noted.  No significant disc space narrowing was noted.  Anterior spurring was noted.  Facet joint arthropathy was noted. Impression: These findings are consistent with levoscoliosis, mild spondylosis and facet joint arthropathy.  XR Pelvis 1-2 Views  Result Date: 01/31/2020 No SI joint to sclerosis or narrowing was noted.  No hip joint narrowing was noted. Impression: Unremarkable x-ray of the SI joints.   Recent Labs: Lab Results  Component Value Date   WBC 5.9 02/01/2020   HGB 14.1 02/01/2020   PLT 265 07/25/2019   NA 138 01/31/2020   K 3.9 01/31/2020   CL 104 01/31/2020   CO2 26 01/31/2020   GLUCOSE 105 (H) 01/31/2020   BUN 14 01/31/2020   CREATININE  0.94 01/31/2020   BILITOT 0.5 01/31/2020   ALKPHOS 85 07/25/2019   AST 33 (H) 01/31/2020   ALT 57 (H) 01/31/2020   PROT 7.4 01/31/2020   ALBUMIN 4.2 07/25/2019   CALCIUM 9.4 01/31/2020   GFRAA 86 01/31/2020   January 31, 2020 UA negative, CK 63, ANA negative, Ro antibody>8.0, (SCL 70, RNP, [place], [place], dsDNA antibodies negative, C3-C4 normal  08/17/19: ANA+, ESR 26, CRP 7.   Speciality Comments: No specialty comments available.  Procedures:  No procedures performed Allergies: Lisinopril-hydrochlorothiazide and Amoxicillin   Assessment / Plan:     Visit Diagnoses: Sjogren's syndrome with keratoconjunctivitis sicca (HCC) - Repeat ANA negative, anti-Ro Ab > 8.0.  Patient gives history of dry mouth and dry eyes.  Detailed counsel regarding Sjogren's syndrome was provided.  I discussed over-the-counter products with her.  If her symptoms do not improve pilocarpine could be  added.  I do not see the need for immunosuppressive agents at this point.  Association of Ro antibody with arrhythmias was also discussed.  Patient states that she had an abnormal EKG at her PCPs office and she was referred to a cardiologist.  Chronic right-sided low back pain with right-sided sciatica - Mild spondylosis and facet joint arthropathy was noted on the x-rays at the last visit.  Mild levoscoliosis was noted.  The x-ray findings were discussed.  Weight loss diet and exercise was emphasized.  A handout on back exercises was given at the last visit..  Chronic right SI joint pain - X-rays were unremarkable.  Chronic pain syndrome - Is on gabapentin and Flexeril by her PCP.  Essential hypertension - Advised treatment for hypertension at the last visit.  Her blood pressure is better this visit.  Elevated LFTs - AST 33, ALT 57.  I am uncertain about the etiology of elevated LFTs.  It may be due to fatty liver.  We will repeat LFTs at the follow-up visit.  If they are persistently elevated then she may need GI  work-up.  History of migraine - Treated with Topamax.  Vitamin D deficiency-she is on supplements.  Orders: No orders of the defined types were placed in this encounter.  No orders of the defined types were placed in this encounter.  .  Follow-Up Instructions: Return in about 3 months (around 05/27/2020) for Sjogren's.   Bo Merino, MD  Note - This record has been created using Editor, commissioning.  Chart creation errors have been sought, but may not always  have been located. Such creation errors do not reflect on  the standard of medical care.

## 2020-02-25 ENCOUNTER — Ambulatory Visit: Payer: BC Managed Care – PPO | Admitting: Registered Nurse

## 2020-02-25 ENCOUNTER — Other Ambulatory Visit: Payer: Self-pay

## 2020-02-25 ENCOUNTER — Encounter: Payer: Self-pay | Admitting: Registered Nurse

## 2020-02-25 VITALS — BP 163/93 | HR 84 | Temp 98.0°F | Resp 18 | Ht 65.0 in | Wt 246.8 lb

## 2020-02-25 DIAGNOSIS — R350 Frequency of micturition: Secondary | ICD-10-CM | POA: Diagnosis not present

## 2020-02-25 LAB — POCT URINALYSIS DIP (CLINITEK)
Bilirubin, UA: NEGATIVE
Glucose, UA: NEGATIVE mg/dL
Leukocytes, UA: NEGATIVE
Nitrite, UA: NEGATIVE
POC PROTEIN,UA: NEGATIVE
Spec Grav, UA: 1.025 (ref 1.010–1.025)
Urobilinogen, UA: 0.2 E.U./dL
pH, UA: 6 (ref 5.0–8.0)

## 2020-02-25 MED ORDER — NITROFURANTOIN MONOHYD MACRO 100 MG PO CAPS: 100.0000 mg | ORAL_CAPSULE | Freq: Two times a day (BID) | ORAL | 0 refills | Status: AC

## 2020-02-25 NOTE — Patient Instructions (Signed)
° ° ° °  If you have lab work done today you will be contacted with your lab results within the next 2 weeks.  If you have not heard from us then please contact us. The fastest way to get your results is to register for My Chart. ° ° °IF you received an x-ray today, you will receive an invoice from [facility]. Please contact [facility] at [phone] with questions or concerns regarding your invoice.  ° °IF you received labwork today, you will receive an invoice from [facility]. Please contact [facility] at [phone] with questions or concerns regarding your invoice.  ° °Our billing staff will not be able to assist you with questions regarding bills from these companies. ° °You will be contacted with the lab results as soon as they are available. The fastest way to get your results is to activate your My Chart account. Instructions are located on the last page of this paperwork. If you have not heard from us regarding the results in 2 weeks, please contact this office. °  ° ° ° °

## 2020-02-25 NOTE — Progress Notes (Signed)
Established Patient Office Visit  Subjective:  Patient ID: Sheila Duran, female    DOB: [birth year]  Age: 45 y.o. MRN: [id number]  CC:  Chief Complaint  Patient presents with  . Follow-up    Patient states she was seen on 02/01/2020 and was dx with an uti and never received the medication and is experiencing the same symptoms     HPI Sheila Duran presents for ongoing UTI symptoms  Flank pain, dysuria, frequency occ nausea, has vomited once, unsure if related No fevers chills sweats No aub, changes to bm, or gross hematuria  Past Medical History:  Diagnosis Date  . Hypertension     Past Surgical History:  Procedure Laterality Date  . ABDOMINAL HYSTERECTOMY    . CESAREAN SECTION      Family History  Problem Relation Age of Onset  . Diabetes Mother   . Hypertension Mother   . Kidney failure Mother   . Hypertension Father   . Healthy Sister   . Healthy Brother   . Healthy Daughter   . Healthy Son   . Healthy Sister   . Healthy Son     Social History   Socioeconomic History  . Marital status: Married    Spouse name: Not on file  . Number of children: 3  . Years of education: Not on file  . Highest education level: Not on file  Occupational History  . Not on file  Tobacco Use  . Smoking status: Never Smoker  . Smokeless tobacco: Never Used  Vaping Use  . Vaping Use: Never used  Substance and Sexual Activity  . Alcohol use: Yes    Comment: rare  . Drug use: Never  . Sexual activity: Yes  Other Topics Concern  . Not on file  Social History Narrative  . Not on file   Social Determinants of Health   Financial Resource Strain: Not on file  Food Insecurity: Not on file  Transportation Needs: Not on file  Physical Activity: Not on file  Stress: Not on file  Social Connections: Not on file  Intimate Partner Violence: Not on file    Outpatient Medications Prior to Visit  Medication Sig Dispense Refill  . amLODipine (NORVASC) 10 MG tablet Take 1  tablet (10 mg total) by mouth daily. 90 tablet 3  . gabapentin (NEURONTIN) 300 MG capsule TAKE 1 CAPSULE BY MOUTH THREE TIMES A DAY 270 capsule 1  . losartan (COZAAR) 100 MG tablet TAKE 1 TABLET BY MOUTH EVERY DAY 90 tablet 1  . metoprolol succinate (TOPROL-XL) 50 MG 24 hr tablet Take 1 tablet (50 mg total) by mouth daily. Take with or immediately following a meal. 90 tablet 3  . spironolactone (ALDACTONE) 100 MG tablet Take 100 mg by mouth daily.    Marland Kitchen topiramate (TOPAMAX) 100 MG tablet TAKE 1 TABLET BY MOUTH TWICE A DAY 60 tablet 1   No facility-administered medications prior to visit.    Allergies  Allergen Reactions  . Lisinopril-Hydrochlorothiazide Cough    And chest pain   . Amoxicillin Rash    ROS Review of Systems  Constitutional: Negative.   HENT: Negative.   Eyes: Negative.   Respiratory: Negative.   Cardiovascular: Negative.   Gastrointestinal: Negative.   Genitourinary: Positive for dysuria and frequency.  Musculoskeletal: Negative.   Skin: Negative.   Neurological: Negative.   Psychiatric/Behavioral: Negative.   All other systems reviewed and are negative.     Objective:    Physical Exam Vitals and nursing  note reviewed.  Constitutional:      General: She is not in acute distress.    Appearance: Normal appearance. She is normal weight. She is not ill-appearing, toxic-appearing or diaphoretic.  Cardiovascular:     Rate and Rhythm: Normal rate and regular rhythm.     Heart sounds: Normal heart sounds. No murmur heard. No friction rub. No gallop.   Pulmonary:     Effort: Pulmonary effort is normal. No respiratory distress.     Breath sounds: Normal breath sounds. No stridor. No wheezing, rhonchi or rales.  Chest:     Chest wall: No tenderness.  Skin:    General: Skin is warm and dry.  Neurological:     General: No focal deficit present.     Mental Status: She is alert and oriented to person, place, and time. Mental status is at baseline.  Psychiatric:         Mood and Affect: Mood normal.        Behavior: Behavior normal.        Thought Content: Thought content normal.        Judgment: Judgment normal.     BP (!) 163/93   Pulse 84   Temp 98 F (36.7 C) (Temporal)   Resp 18   Ht 5\' 5"  (1.651 m)   Wt 246 lb 12.8 oz (111.9 kg)   SpO2 98%   BMI 41.07 kg/m  Wt Readings from Last 3 Encounters:  02/25/20 246 lb 12.8 oz (111.9 kg)  02/01/20 246 lb 12.8 oz (111.9 kg)  01/31/20 252 lb (114.3 kg)     There are no preventive care reminders to display for this patient.  There are no preventive care reminders to display for this patient.  Lab Results  Component Value Date   TSH 1.270 02/01/2020   Lab Results  Component Value Date   WBC 5.9 02/01/2020   HGB 14.1 02/01/2020   HCT 42.3 02/01/2020   MCV 87 02/01/2020   PLT 265 07/25/2019   Lab Results  Component Value Date   NA 138 01/31/2020   K 3.9 01/31/2020   CO2 26 01/31/2020   GLUCOSE 105 (H) 01/31/2020   BUN 14 01/31/2020   CREATININE 0.94 01/31/2020   BILITOT 0.5 01/31/2020   ALKPHOS 85 07/25/2019   AST 33 (H) 01/31/2020   ALT 57 (H) 01/31/2020   PROT 7.4 01/31/2020   ALBUMIN 4.2 07/25/2019   CALCIUM 9.4 01/31/2020   Lab Results  Component Value Date   CHOL 155 07/25/2019   Lab Results  Component Value Date   HDL 46 07/25/2019   Lab Results  Component Value Date   LDLCALC 83 07/25/2019   Lab Results  Component Value Date   TRIG 151 (H) 07/25/2019   Lab Results  Component Value Date   CHOLHDL 3.4 07/25/2019   Lab Results  Component Value Date   HGBA1C 6.1 (H) 07/25/2019      Assessment & Plan:   Problem List Items Addressed This Visit   None   Visit Diagnoses    Frequent urination    -  Primary   Relevant Medications   nitrofurantoin, macrocrystal-monohydrate, (MACROBID) 100 MG capsule   Other Relevant Orders   POCT URINALYSIS DIP (CLINITEK) (Completed)   Urine Culture      Meds ordered this encounter  Medications  .  nitrofurantoin, macrocrystal-monohydrate, (MACROBID) 100 MG capsule    Sig: Take 1 capsule (100 mg total) by mouth 2 (two) times daily for 5  days.    Dispense:  10 capsule    Refill:  0    Order Specific Question:   Supervising Provider    Answer:   Neva Seat, JEFFREY R [[id number]]    Follow-up: No follow-ups on file.   PLAN  macrobid po bid 5 days  Urine culture sent  Return prn  Patient encouraged to call clinic with any questions, comments, or concerns.  Janeece Agee, NP

## 2020-02-28 ENCOUNTER — Encounter: Payer: Self-pay | Admitting: Rheumatology

## 2020-02-28 ENCOUNTER — Ambulatory Visit (INDEPENDENT_AMBULATORY_CARE_PROVIDER_SITE_OTHER): Payer: BC Managed Care – PPO | Admitting: Rheumatology

## 2020-02-28 ENCOUNTER — Other Ambulatory Visit: Payer: Self-pay

## 2020-02-28 VITALS — BP 138/90 | HR 76 | Ht 65.0 in | Wt 249.6 lb

## 2020-02-28 DIAGNOSIS — M533 Sacrococcygeal disorders, not elsewhere classified: Secondary | ICD-10-CM | POA: Diagnosis not present

## 2020-02-28 DIAGNOSIS — M5441 Lumbago with sciatica, right side: Secondary | ICD-10-CM

## 2020-02-28 DIAGNOSIS — G894 Chronic pain syndrome: Secondary | ICD-10-CM

## 2020-02-28 DIAGNOSIS — G8929 Other chronic pain: Secondary | ICD-10-CM

## 2020-02-28 DIAGNOSIS — R768 Other specified abnormal immunological findings in serum: Secondary | ICD-10-CM

## 2020-02-28 DIAGNOSIS — R7989 Other specified abnormal findings of blood chemistry: Secondary | ICD-10-CM

## 2020-02-28 DIAGNOSIS — I10 Essential (primary) hypertension: Secondary | ICD-10-CM

## 2020-02-28 DIAGNOSIS — M3501 Sicca syndrome with keratoconjunctivitis: Secondary | ICD-10-CM | POA: Diagnosis not present

## 2020-02-28 DIAGNOSIS — E559 Vitamin D deficiency, unspecified: Secondary | ICD-10-CM

## 2020-02-28 DIAGNOSIS — Z8669 Personal history of other diseases of the nervous system and sense organs: Secondary | ICD-10-CM

## 2020-02-28 LAB — URINE CULTURE

## 2020-02-28 NOTE — Patient Instructions (Signed)
Sjgren's Syndrome Sjgren's syndrome is a disease in which the body's disease-fighting system (immune system) attacks the glands that produce tears (lacrimal glands) and the glands that produce saliva (salivary glands). This makes the eyes and mouth very dry. Sjgren's syndrome is a long-term (chronic) disorder that has no cure. In some cases, it is linked to other disorders (rheumatic disorders), such as rheumatoid arthritis and systemic lupus erythematosus (SLE). It may affect other parts of the body, such as the:  Kidneys.  Blood vessels.  Joints.  Lungs.  Liver.  Pancreas.  Brain.  Nerves.  Spinal cord. What are the causes? The cause of this condition is not known. It may be passed along from parent to child (inherited), or it may be a symptom of a rheumatic disorder. What increases the risk? This condition is more likely to develop in:  Women.  People who are 45-50 years old.  People who have recently had a viral infection or currently have a viral infection. What are the signs or symptoms? The main symptoms of this condition are:  Dry mouth. This may include: ? A chalky feeling. ? Difficulty swallowing, speaking, or tasting. ? Frequent cavities in the teeth. ? Frequent mouth infections.  Dry eyes. This may include: ? Burning, redness, and itching. ? Blurry vision. ? Light sensitivity. Other symptoms may include:  Dryness of the skin and the inside of the nose.  Eyelid infections.  Vaginal dryness, if this applies.  Joint pain and stiffness.  Muscle pain and stiffness. How is this diagnosed? This condition is diagnosed based on:  Your symptoms.  Your medical history.  A physical exam of your eyes and mouth.  Tests, including: ? A Schirmer test. This tests your tear production. ? An eye exam that is done with a magnifying device (slit-lamp exam). ? An eye test that temporarily stains your eye with dye. This shows the extent of eye  damage. ? Tests to check your salivary gland function. ? Biopsy. This is a removal of part of a salivary gland from inside your lower lip to be studied under a microscope. ? Chest X-rays. ? Blood tests. ? Urine tests. How is this treated? There is no cure for this condition, but treatment can help you manage your symptoms. This condition may be treated with:  Moisture replacement therapies to help relieve dryness in your skin, mouth, and eyes.  Medicines to help relieve pain and stiffness.  Medicines to help relieve inflammation in your body (corticosteroids). These are usually for severe cases.  Medicines to help reduce the activity of your immune system (immunosuppressants).  Surgery or insertion of plugs to close the lacrimal glands (punctal occlusion). This helps keep more natural tears in your eyes. Follow these instructions at home: Eye care  Use eye drops as told by your health care provider.  Protect your eyes from the sun and wind with sunglasses or glasses.  Blink at least 5-6 times a minute.  Maintain properly humidified air. You may want to use a humidifier at home.  Avoid smoke.   Mouth care  Brush your teeth and floss after every meal.  Chew sugar-free gum or suck on hard candy. This may help to relieve dry mouth.  Use antimicrobial mouthwash daily.  Take frequent sips of water or sugar-free drinks.  Use saliva substitutes or lip balm as told by your health care provider.  Schedule and attend dentist visits every 6 months. General instructions  Take over-the-counter and prescription medicines only as told by   your health care provider.  Drink enough fluid to keep your urine pale yellow.  Keep all follow-up visits as told by your health care provider. This is important.   Contact a health care provider if:  You have a fever.  You have night sweats.  You are always tired.  You have unexplained weight loss.  You develop itchy skin.  You have red  patches on your skin.  You have a lump or swelling on your neck. Summary  Sjgren's syndrome is a disease in which the body's disease-fighting system attacks the glands that produce tears and the glands that produce saliva.  This condition makes the eyes and mouth very dry.  Sjgren's syndrome is a long-term (chronic) disorder that has no cure.  The cause of this condition is not known.  There is no cure for this condition, but treatment can help you manage your symptoms. This information is not intended to replace advice given to you by your health care provider. Make sure you discuss any questions you have with your health care provider. Document Revised: 11/17/2017 Document Reviewed: 11/17/2017 Elsevier Patient Education  2021 [employer] Inc[employer]  

## 2020-03-02 ENCOUNTER — Other Ambulatory Visit: Payer: Self-pay | Admitting: Registered Nurse

## 2020-03-02 DIAGNOSIS — G8929 Other chronic pain: Secondary | ICD-10-CM

## 2020-03-02 DIAGNOSIS — Z8669 Personal history of other diseases of the nervous system and sense organs: Secondary | ICD-10-CM

## 2020-03-02 NOTE — Telephone Encounter (Signed)
Requested medication (s) are due for refill today: Yes  Requested medication (s) are on the active medication list: Yes  Last refill:  02/04/20  Future visit scheduled: No  Notes to clinic:  See request.    Requested Prescriptions  Pending Prescriptions Disp Refills   topiramate (TOPAMAX) 100 MG tablet [Pharmacy Med Name: TOPIRAMATE 100 MG TABLET] 60 tablet 1    Sig: TAKE 1 TABLET BY MOUTH TWICE A DAY      Not Delegated - Neurology: Anticonvulsants - topiramate & zonisamide Failed - 03/02/2020 11:30 AM      Failed - This refill cannot be delegated      Passed - Cr in normal range and within 360 days    Creat  Date Value Ref Range Status  01/31/2020 0.94 0.50 - 1.10 mg/dL Final          Passed - CO2 in normal range and within 360 days    CO2  Date Value Ref Range Status  01/31/2020 26 20 - 32 mmol/L Final          Passed - Valid encounter within last 12 months    Recent Outpatient Visits           6 days ago Frequent urination   [facility], Gerlene Burdock, NP   1 month ago Cold sweat   [facility], Gerlene Burdock, NP   6 months ago Essential hypertension   [facility], Gerlene Burdock, NP   7 months ago Other chronic pain   [facility], Gerlene Burdock, NP       Future Appointments             In 2 months Amada [place] Lenice Llamas [facility]

## 2020-05-16 NOTE — Progress Notes (Deleted)
Office Visit Note  Patient: Sheila Duran             Date of Birth: 27-Jan-1975           MRN: [id number]             PCP: Janeece Agee, NP Referring: Janeece Agee, NP Visit Date: 05/30/2020 Occupation: @GUAROCC @  Subjective:  No chief complaint on file.   History of Present Illness: Sheila Duran is a 45 y.o. female ***   Activities of Daily Living:  Patient reports morning stiffness for *** {minute/hour:19697}.   Patient {ACTIONS;DENIES/REPORTS:21021675::"Denies"} nocturnal pain.  Difficulty dressing/grooming: {ACTIONS;DENIES/REPORTS:21021675::"Denies"} Difficulty climbing stairs: {ACTIONS;DENIES/REPORTS:21021675::"Denies"} Difficulty getting out of chair: {ACTIONS;DENIES/REPORTS:21021675::"Denies"} Difficulty using hands for taps, buttons, cutlery, and/or writing: {ACTIONS;DENIES/REPORTS:21021675::"Denies"}  No Rheumatology ROS completed.   PMFS History:  Patient Active Problem List   Diagnosis Date Noted  . History of migraine 01/31/2020  . Essential hypertension 01/31/2020  . Chronic pain syndrome 01/31/2020    Past Medical History:  Diagnosis Date  . Hypertension     Family History  Problem Relation Age of Onset  . Diabetes Mother   . Hypertension Mother   . Kidney failure Mother   . Hypertension Father   . Healthy Sister   . Healthy Brother   . Healthy Daughter   . Healthy Son   . Healthy Sister   . Healthy Son    Past Surgical History:  Procedure Laterality Date  . ABDOMINAL HYSTERECTOMY    . CESAREAN SECTION     Social History   Social History Narrative  . Not on file   Immunization History  Administered Date(s) Administered  . Moderna Sars-Covid-2 Vaccination 03/02/2019, 03/30/2019, 01/14/2020     Objective: Vital Signs: There were no vitals taken for this visit.   Physical Exam   Musculoskeletal Exam: ***  CDAI Exam: CDAI Score: -- Patient Global: --; Provider Global: -- Swollen: --; Tender: -- Joint Exam 05/30/2020   No  joint exam has been documented for this visit   There is currently no information documented on the homunculus. Go to the Rheumatology activity and complete the homunculus joint exam.  Investigation: No additional findings.  Imaging: No results found.  Recent Labs: Lab Results  Component Value Date   WBC 5.9 02/01/2020   HGB 14.1 02/01/2020   PLT 265 07/25/2019   NA 138 01/31/2020   K 3.9 01/31/2020   CL 104 01/31/2020   CO2 26 01/31/2020   GLUCOSE 105 (H) 01/31/2020   BUN 14 01/31/2020   CREATININE 0.94 01/31/2020   BILITOT 0.5 01/31/2020   ALKPHOS 85 07/25/2019   AST 33 (H) 01/31/2020   ALT 57 (H) 01/31/2020   PROT 7.4 01/31/2020   ALBUMIN 4.2 07/25/2019   CALCIUM 9.4 01/31/2020   GFRAA 86 01/31/2020    Speciality Comments: No specialty comments available.  Procedures:  No procedures performed Allergies: Lisinopril-hydrochlorothiazide and Amoxicillin   Assessment / Plan:     Visit Diagnoses: Sjogren's syndrome with keratoconjunctivitis sicca (HCC)  Chronic pain syndrome  Chronic right SI joint pain  Essential hypertension  Elevated LFTs  History of migraine  Vitamin D deficiency  Orders: No orders of the defined types were placed in this encounter.  No orders of the defined types were placed in this encounter.   Face-to-face time spent with patient was *** minutes. Greater than 50% of time was spent in counseling and coordination of care.  Follow-Up Instructions: No follow-ups on file.   03/30/2020, PA-C  Note - This record has been created using Dragon software.  Chart creation errors have been sought, but may not always  have been located. Such creation errors do not reflect on  the standard of medical care.  

## 2020-05-30 ENCOUNTER — Ambulatory Visit: Payer: BC Managed Care – PPO | Admitting: Physician Assistant

## 2020-05-30 DIAGNOSIS — G8929 Other chronic pain: Secondary | ICD-10-CM

## 2020-05-30 DIAGNOSIS — R7989 Other specified abnormal findings of blood chemistry: Secondary | ICD-10-CM

## 2020-05-30 DIAGNOSIS — E559 Vitamin D deficiency, unspecified: Secondary | ICD-10-CM

## 2020-05-30 DIAGNOSIS — G894 Chronic pain syndrome: Secondary | ICD-10-CM

## 2020-05-30 DIAGNOSIS — M3501 Sicca syndrome with keratoconjunctivitis: Secondary | ICD-10-CM

## 2020-05-30 DIAGNOSIS — Z8669 Personal history of other diseases of the nervous system and sense organs: Secondary | ICD-10-CM

## 2020-05-30 DIAGNOSIS — I10 Essential (primary) hypertension: Secondary | ICD-10-CM

## 2020-06-03 ENCOUNTER — Other Ambulatory Visit: Payer: Self-pay

## 2020-06-03 ENCOUNTER — Ambulatory Visit (INDEPENDENT_AMBULATORY_CARE_PROVIDER_SITE_OTHER): Payer: BC Managed Care – PPO | Admitting: Registered Nurse

## 2020-06-03 ENCOUNTER — Encounter: Payer: Self-pay | Admitting: Registered Nurse

## 2020-06-03 VITALS — BP 180/124 | HR 91 | Temp 98.0°F | Resp 16 | Ht 64.5 in | Wt 248.6 lb

## 2020-06-03 DIAGNOSIS — I10 Essential (primary) hypertension: Secondary | ICD-10-CM | POA: Diagnosis not present

## 2020-06-03 DIAGNOSIS — Z13220 Encounter for screening for lipoid disorders: Secondary | ICD-10-CM | POA: Diagnosis not present

## 2020-06-03 DIAGNOSIS — Z130 Encounter for screening for diseases of the blood and blood-forming organs and certain disorders involving the immune mechanism: Secondary | ICD-10-CM

## 2020-06-03 DIAGNOSIS — Z13228 Encounter for screening for other metabolic disorders: Secondary | ICD-10-CM | POA: Diagnosis not present

## 2020-06-03 DIAGNOSIS — Z1329 Encounter for screening for other suspected endocrine disorder: Secondary | ICD-10-CM

## 2020-06-03 DIAGNOSIS — Z0000 Encounter for general adult medical examination without abnormal findings: Secondary | ICD-10-CM | POA: Diagnosis not present

## 2020-06-03 LAB — CBC WITH DIFFERENTIAL/PLATELET
Basophils Absolute: 0 10*3/uL (ref 0.0–0.1)
Basophils Relative: 0.3 % (ref 0.0–3.0)
Eosinophils Absolute: 0.1 10*3/uL (ref 0.0–0.7)
Eosinophils Relative: 1.3 % (ref 0.0–5.0)
HCT: 39.6 % (ref 36.0–46.0)
Hemoglobin: 13.3 g/dL (ref 12.0–15.0)
Lymphocytes Relative: 25 % (ref 12.0–46.0)
Lymphs Abs: 1.5 10*3/uL (ref 0.7–4.0)
MCHC: 33.7 g/dL (ref 30.0–36.0)
MCV: 87.6 fl (ref 78.0–100.0)
Monocytes Absolute: 0.3 10*3/uL (ref 0.1–1.0)
Monocytes Relative: 5 % (ref 3.0–12.0)
Neutro Abs: 4.2 10*3/uL (ref 1.4–7.7)
Neutrophils Relative %: 68.4 % (ref 43.0–77.0)
Platelets: 261 10*3/uL (ref 150.0–400.0)
RBC: 4.52 Mil/uL (ref 3.87–5.11)
RDW: 13.3 % (ref 11.5–15.5)
WBC: 6.1 10*3/uL (ref 4.0–10.5)

## 2020-06-03 LAB — COMPREHENSIVE METABOLIC PANEL
ALT: 53 U/L — ABNORMAL HIGH (ref 0–35)
AST: 32 U/L (ref 0–37)
Albumin: 4.1 g/dL (ref 3.5–5.2)
Alkaline Phosphatase: 53 U/L (ref 39–117)
BUN: 12 mg/dL (ref 6–23)
CO2: 25 mEq/L (ref 19–32)
Calcium: 8.9 mg/dL (ref 8.4–10.5)
Chloride: 105 mEq/L (ref 96–112)
Creatinine, Ser: 0.86 mg/dL (ref 0.40–1.20)
GFR: 81.94 mL/min (ref >60.00)
Glucose, Bld: 115 mg/dL — ABNORMAL HIGH (ref 70–99)
Potassium: 3.7 mEq/L (ref 3.5–5.1)
Sodium: 139 mEq/L (ref 135–145)
Total Bilirubin: 0.6 mg/dL (ref 0.2–1.2)
Total Protein: 7.3 g/dL (ref 6.0–8.3)

## 2020-06-03 LAB — LIPID PANEL
Cholesterol: 170 mg/dL (ref 0–200)
HDL: 41.8 mg/dL (ref >39.00)
LDL Cholesterol: 91 mg/dL (ref 0–99)
NonHDL: 128.39
Total CHOL/HDL Ratio: 4
Triglycerides: 186 mg/dL — ABNORMAL HIGH (ref 0.0–149.0)
VLDL: 37.2 mg/dL (ref 0.0–40.0)

## 2020-06-03 LAB — POCT GLYCOSYLATED HEMOGLOBIN (HGB A1C): Hemoglobin A1C: 5.9 % — AB (ref 4.0–5.6)

## 2020-06-03 LAB — TSH: TSH: 0.68 u[IU]/mL (ref 0.35–4.50)

## 2020-06-03 NOTE — Patient Instructions (Addendum)
Ms. Sheila Duran -   [name] to see you today.  Let's get you in to see a Cardiologist. I'd like for this to happen within a week or two.  Your blood pressure is still high - we can have cardiology help with this  Labs will be back tomorrow, I'll let you know if there's any concerns.  Thank you  [name]     If you have lab work done today you will be contacted with your lab results within the next 2 weeks.  If you have not heard from [place] then please contact us. The fastest way to get your results is to register for My Chart.   IF you received an x-ray today, you will receive an invoice from [facility]. Please contact [facility] at [phone] with questions or concerns regarding your invoice.   IF you received labwork today, you will receive an invoice from [facility]. Please contact [facility] at [phone] with questions or concerns regarding your invoice.   Our billing staff will not be able to assist you with questions regarding bills from these companies.  You will be contacted with the lab results as soon as they are available. The fastest way to get your results is to activate your My Chart account. Instructions are located on the last page of this paperwork. If you have not heard from [place] regarding the results in 2 weeks, please contact this office.

## 2020-07-07 NOTE — Progress Notes (Signed)
Office Visit Note  Patient: Sheila Duran             Date of Birth: Mar 04, 1975           MRN: [id number]             PCP: Janeece Agee, NP Referring: Janeece Agee, NP Visit Date: 07/10/2020 Occupation: @GUAROCC @  Subjective:  Left shoulder pain.   History of Present Illness: Sheila Duran is a 45 y.o. female with a history of Sjogren's and osteoarthritis.  She states that her sicca symptoms are tolerable with over-the-counter medications.  For the last 2 weeks she has been having increased pain and discomfort in the left shoulder.  She has difficulty sleeping on that side.  She also has difficulty lifting her arm.  She has similar issue with her right shoulder joint in the past.  None of the other joints are painful.  The lower back pain is tolerable and is intermittent.  Activities of Daily Living:  Patient reports morning stiffness for 10 minutes.   Patient Denies nocturnal pain.  Difficulty dressing/grooming: Denies Difficulty climbing stairs: Denies Difficulty getting out of chair: Denies Difficulty using hands for taps, buttons, cutlery, and/or writing: Denies  Review of Systems  Constitutional:  Positive for night sweats. Negative for fatigue.  HENT:  Negative for mouth dryness.   Eyes:  Negative for dryness.  Respiratory:  Negative for shortness of breath.   Cardiovascular:  Negative for swelling in legs/feet.  Gastrointestinal:  Negative for constipation.  Endocrine: Positive for heat intolerance and increased urination.  Genitourinary:  Negative for difficulty urinating.  Musculoskeletal:  Positive for joint pain, joint pain, morning stiffness and muscle tenderness.  Skin:  Negative for rash.  Allergic/Immunologic: Negative for susceptible to infections.  Neurological:  Positive for night sweats.  Hematological:  Positive for bruising/bleeding tendency.  Psychiatric/Behavioral:  Negative for sleep disturbance.    PMFS History:  Patient Active Problem List    Diagnosis Date Noted   History of migraine 01/31/2020   Essential hypertension 01/31/2020   Chronic pain syndrome 01/31/2020    Past Medical History:  Diagnosis Date   Hypertension    Sjogren's syndrome (HCC)     Family History  Problem Relation Age of Onset   Diabetes Mother    Hypertension Mother    Kidney failure Mother    Hypertension Father    Healthy Sister    Healthy Brother    Healthy Daughter    Healthy Son    Healthy Sister    Healthy Son    Past Surgical History:  Procedure Laterality Date   ABDOMINAL HYSTERECTOMY     CESAREAN SECTION     Social History   Social History Narrative   Not on file   Immunization History  Administered Date(s) Administered   Moderna Sars-Covid-2 Vaccination 03/02/2019, 03/30/2019, 01/14/2020     Objective: Vital Signs: BP (!) 157/98 (BP Location: Left Arm, Patient Position: Sitting, Cuff Size: Normal)   Pulse 76   Resp 15   Ht 5\' 4"  (1.626 m)   Wt 241 lb (109.3 kg)   BMI 41.37 kg/m    Physical Exam Vitals and nursing note reviewed.  Constitutional:      Appearance: She is well-developed.  HENT:     Head: Normocephalic and atraumatic.  Eyes:     Conjunctiva/sclera: Conjunctivae normal.  Cardiovascular:     Rate and Rhythm: Normal rate and regular rhythm.     Heart sounds: Normal heart sounds.  Pulmonary:  Effort: Pulmonary effort is normal.     Breath sounds: Normal breath sounds.  Abdominal:     General: Bowel sounds are normal.     Palpations: Abdomen is soft.  Musculoskeletal:     Cervical back: Normal range of motion.  Lymphadenopathy:     Cervical: No cervical adenopathy.  Skin:    General: Skin is warm and dry.     Capillary Refill: Capillary refill takes less than 2 seconds.  Neurological:     Mental Status: She is alert and oriented to person, place, and time.  Psychiatric:        Behavior: Behavior normal.     Musculoskeletal Exam: Spine was in good range of motion.  Shoulder joints with  good range of motion.  She had discomfort with abduction of her left shoulder joint.  She had tenderness in the subacromial region.  Elbow joints, wrist joints, MCPs PIPs and DIPs with good range of motion with no synovitis.  Hip joints, knee joints, ankles, MTPs with good range of motion with no synovitis.  CDAI Exam: CDAI Score: -- Patient Global: --; Provider Global: -- Swollen: --; Tender: -- Joint Exam 07/10/2020   No joint exam has been documented for this visit   There is currently no information documented on the homunculus. Go to the Rheumatology activity and complete the homunculus joint exam.  Investigation: No additional findings.  Imaging: No results found.  Recent Labs: Lab Results  Component Value Date   WBC 6.1 06/03/2020   HGB 13.3 06/03/2020   PLT 261.0 06/03/2020   NA 139 06/03/2020   K 3.7 06/03/2020   CL 105 06/03/2020   CO2 25 06/03/2020   GLUCOSE 115 (H) 06/03/2020   BUN 12 06/03/2020   CREATININE 0.86 06/03/2020   BILITOT 0.6 06/03/2020   ALKPHOS 53 06/03/2020   AST 32 06/03/2020   ALT 53 (H) 06/03/2020   PROT 7.3 06/03/2020   ALBUMIN 4.1 06/03/2020   CALCIUM 8.9 06/03/2020   GFRAA 86 01/31/2020    Speciality Comments: No specialty comments available.  Procedures:  Large Joint Inj on 07/10/2020 11:39 AM Indications: pain Details: 27 G 1.5 in needle, posterior approach  Arthrogram: No  Medications: 40 mg triamcinolone acetonide 40 MG/ML; 1 mL lidocaine 1 % Aspirate: 0 mL Outcome: tolerated well, no immediate complications Procedure, treatment alternatives, risks and benefits explained, specific risks discussed. Consent was given by the patient. Immediately prior to procedure a time out was called to verify the correct patient, procedure, equipment, support staff and site/side marked as required. Patient was prepped and draped in the usual sterile fashion.    Allergies: Lisinopril-hydrochlorothiazide and Amoxicillin   Assessment / Plan:      Visit Diagnoses: Sjogren's syndrome with keratoconjunctivitis sicca (HCC) - Repeat ANA negative, anti-Ro Ab > 8.0.  Patient gives history of dry mouth and dry eyes.  She has been using over-the-counter products which has been helpful.  Acute pain of left shoulder -she has pain and discomfort in her left shoulder joint.  She had discomfort with abduction of her left shoulder joint.  Tenderness was noted over subacromial region.  Different treatment options and their side effects were discussed.  She wants to proceed with cortisone injection.  Indications side effects contraindications were discussed.  Left shoulder joint was injected as described above.  She tolerated the procedure well.  I have also given her a handout on shoulder exercises.  Plan: XR Shoulder Left  Chronic right-sided low back pain with right-sided  sciatica - Mild spondylosis and facet joint arthropathy was noted on the x-rays.  Mild levoscoliosis was noted.   Chronic right SI joint pain - X-rays were unremarkable.  Chronic pain syndrome - Is on gabapentin and Flexeril by her PCP.  Essential hypertension-blood pressure is elevated today.  Have advised her to monitor blood pressure closely and follow-up with her PCP.  History of migraine  Vitamin D deficiency  Elevated LFTs-improving.  Weight loss diet and exercise was discussed.  Orders: Orders Placed This Encounter  Procedures   Large Joint Inj   XR Shoulder Left   No orders of the defined types were placed in this encounter.   Follow-Up Instructions: Return in about 6 months (around 01/09/2021) for Sjogren's. Cathlean Sauer of the shoulder joint was unremarkable.  Pollyann Savoy, MD  Note - This record has been created using Animal nutritionist.  Chart creation errors have been sought, but may not always  have been located. Such creation errors do not reflect on  the standard of medical care.

## 2020-07-10 ENCOUNTER — Ambulatory Visit: Payer: BC Managed Care – PPO | Admitting: Rheumatology

## 2020-07-10 ENCOUNTER — Encounter: Payer: Self-pay | Admitting: Rheumatology

## 2020-07-10 ENCOUNTER — Other Ambulatory Visit: Payer: Self-pay

## 2020-07-10 ENCOUNTER — Ambulatory Visit: Payer: Self-pay

## 2020-07-10 VITALS — BP 157/98 | HR 76 | Resp 15 | Ht 64.0 in | Wt 241.0 lb

## 2020-07-10 DIAGNOSIS — R7989 Other specified abnormal findings of blood chemistry: Secondary | ICD-10-CM

## 2020-07-10 DIAGNOSIS — E559 Vitamin D deficiency, unspecified: Secondary | ICD-10-CM

## 2020-07-10 DIAGNOSIS — G8929 Other chronic pain: Secondary | ICD-10-CM

## 2020-07-10 DIAGNOSIS — Z8669 Personal history of other diseases of the nervous system and sense organs: Secondary | ICD-10-CM

## 2020-07-10 DIAGNOSIS — M3501 Sicca syndrome with keratoconjunctivitis: Secondary | ICD-10-CM

## 2020-07-10 DIAGNOSIS — M25512 Pain in left shoulder: Secondary | ICD-10-CM | POA: Diagnosis not present

## 2020-07-10 DIAGNOSIS — M533 Sacrococcygeal disorders, not elsewhere classified: Secondary | ICD-10-CM | POA: Diagnosis not present

## 2020-07-10 DIAGNOSIS — M5441 Lumbago with sciatica, right side: Secondary | ICD-10-CM

## 2020-07-10 DIAGNOSIS — G894 Chronic pain syndrome: Secondary | ICD-10-CM

## 2020-07-10 DIAGNOSIS — I10 Essential (primary) hypertension: Secondary | ICD-10-CM

## 2020-07-10 MED ORDER — LIDOCAINE HCL 1 % IJ SOLN
1.0000 mL | INTRAMUSCULAR | Status: AC | PRN
Start: 2020-07-10 — End: 2020-07-10
  Administered 2020-07-10: 1 mL

## 2020-07-10 MED ORDER — TRIAMCINOLONE ACETONIDE 40 MG/ML IJ SUSP
40.0000 mg | INTRAMUSCULAR | Status: AC | PRN
  Administered 2020-07-10: 40 mg via INTRA_ARTICULAR

## 2020-07-10 NOTE — Patient Instructions (Signed)
Shoulder Exercises Ask your health care provider which exercises are safe for you. Do exercises exactly as told by your health care provider and adjust them as directed. It is normal to feel mild stretching, pulling, tightness, or discomfort as you do these exercises. Stop right away if you feel sudden pain or your pain gets worse. Do not begin these exercises until told by your health care provider. Stretching exercises External rotation and abduction This exercise is sometimes called corner stretch. This exercise rotates your arm outward (external rotation) and moves your arm out from your body (abduction). Stand in a doorway with one of your feet slightly in front of the other. This is called a staggered stance. If you cannot reach your forearms to the door frame, stand facing a corner of a room. Choose one of the following positions as told by your health care provider: Place your hands and forearms on the door frame above your head. Place your hands and forearms on the door frame at the height of your head. Place your hands on the door frame at the height of your elbows. Slowly move your weight onto your front foot until you feel a stretch across your chest and in the front of your shoulders. Keep your head and chest upright and keep your abdominal muscles tight. Hold for __________ seconds. To release the stretch, shift your weight to your back foot. Repeat __________ times. Complete this exercise __________ times a day. Extension, standing Stand and hold a broomstick, a cane, or a similar object behind your back. Your hands should be a little wider than shoulder width apart. Your palms should face away from your back. Keeping your elbows straight and your shoulder muscles relaxed, move the stick away from your body until you feel a stretch in your shoulders (extension). Avoid shrugging your shoulders while you move the stick. Keep your shoulder blades tucked down toward the middle of your  back. Hold for __________ seconds. Slowly return to the starting position. Repeat __________ times. Complete this exercise __________ times a day. Range-of-motion exercises Pendulum  Stand near a wall or a surface that you can hold onto for balance. Bend at the waist and let your left / right arm hang straight down. Use your other arm to support you. Keep your back straight and do not lock your knees. Relax your left / right arm and shoulder muscles, and move your hips and your trunk so your left / right arm swings freely. Your arm should swing because of the motion of your body, not because you are using your arm or shoulder muscles. Keep moving your hips and trunk so your arm swings in the following directions, as told by your health care provider: Side to side. Forward and backward. In clockwise and counterclockwise circles. Continue each motion for __________ seconds, or for as long as told by your health care provider. Slowly return to the starting position. Repeat __________ times. Complete this exercise __________ times a day. Shoulder flexion, standing  Stand and hold a broomstick, a cane, or a similar object. Place your hands a little more than shoulder width apart on the object. Your left / right hand should be palm up, and your other hand should be palm down. Keep your elbow straight and your shoulder muscles relaxed. Push the stick up with your healthy arm to raise your left / right arm in front of your body, and then over your head until you feel a stretch in your shoulder (flexion). Avoid   shrugging your shoulder while you raise your arm. Keep your shoulder blade tucked down toward the middle of your back. Hold for __________ seconds. Slowly return to the starting position. Repeat __________ times. Complete this exercise __________ times a day. Shoulder abduction, standing Stand and hold a broomstick, a cane, or a similar object. Place your hands a little more than shoulder  width apart on the object. Your left / right hand should be palm up, and your other hand should be palm down. Keep your elbow straight and your shoulder muscles relaxed. Push the object across your body toward your left / right side. Raise your left / right arm to the side of your body (abduction) until you feel a stretch in your shoulder. Do not raise your arm above shoulder height unless your health care provider tells you to do that. If directed, raise your arm over your head. Avoid shrugging your shoulder while you raise your arm. Keep your shoulder blade tucked down toward the middle of your back. Hold for __________ seconds. Slowly return to the starting position. Repeat __________ times. Complete this exercise __________ times a day. Internal rotation  Place your left / right hand behind your back, palm up. Use your other hand to dangle an exercise band, a towel, or a similar object over your shoulder. Grasp the band with your left / right hand so you are holding on to both ends. Gently pull up on the band until you feel a stretch in the front of your left / right shoulder. The movement of your arm toward the center of your body is called internal rotation. Avoid shrugging your shoulder while you raise your arm. Keep your shoulder blade tucked down toward the middle of your back. Hold for __________ seconds. Release the stretch by letting go of the band and lowering your hands. Repeat __________ times. Complete this exercise __________ times a day. Strengthening exercises External rotation  Sit in a stable chair without armrests. Secure an exercise band to a stable object at elbow height on your left / right side. Place a soft object, such as a folded towel or a small pillow, between your left / right upper arm and your body to move your elbow about 4 inches (10 cm) away from your side. Hold the end of the exercise band so it is tight and there is no slack. Keeping your elbow pressed  against the soft object, slowly move your forearm out, away from your abdomen (external rotation). Keep your body steady so only your forearm moves. Hold for __________ seconds. Slowly return to the starting position. Repeat __________ times. Complete this exercise __________ times a day. Shoulder abduction  Sit in a stable chair without armrests, or stand up. Hold a __________ weight in your left / right hand, or hold an exercise band with both hands. Start with your arms straight down and your left / right palm facing in, toward your body. Slowly lift your left / right hand out to your side (abduction). Do not lift your hand above shoulder height unless your health care provider tells you that this is safe. Keep your arms straight. Avoid shrugging your shoulder while you do this movement. Keep your shoulder blade tucked down toward the middle of your back. Hold for __________ seconds. Slowly lower your arm, and return to the starting position. Repeat __________ times. Complete this exercise __________ times a day. Shoulder extension Sit in a stable chair without armrests, or stand up. Secure an exercise band   to a stable object in front of you so it is at shoulder height. Hold one end of the exercise band in each hand. Your palms should face each other. Straighten your elbows and lift your hands up to shoulder height. Step back, away from the secured end of the exercise band, until the band is tight and there is no slack. Squeeze your shoulder blades together as you pull your hands down to the sides of your thighs (extension). Stop when your hands are straight down by your sides. Do not let your hands go behind your body. Hold for __________ seconds. Slowly return to the starting position. Repeat __________ times. Complete this exercise __________ times a day. Shoulder row Sit in a stable chair without armrests, or stand up. Secure an exercise band to a stable object in front of you so it  is at waist height. Hold one end of the exercise band in each hand. Position your palms so that your thumbs are facing the ceiling (neutral position). Bend each of your elbows to a 90-degree angle (right angle) and keep your upper arms at your sides. Step back until the band is tight and there is no slack. Slowly pull your elbows back behind you. Hold for __________ seconds. Slowly return to the starting position. Repeat __________ times. Complete this exercise __________ times a day. Shoulder press-ups  Sit in a stable chair that has armrests. Sit upright, with your feet flat on the floor. Put your hands on the armrests so your elbows are bent and your fingers are pointing forward. Your hands should be about even with the sides of your body. Push down on the armrests and use your arms to lift yourself off the chair. Straighten your elbows and lift yourself up as much as you comfortably can. Move your shoulder blades down, and avoid letting your shoulders move up toward your ears. Keep your feet on the ground. As you get stronger, your feet should support less of your body weight as you lift yourself up. Hold for __________ seconds. Slowly lower yourself back into the chair. Repeat __________ times. Complete this exercise __________ times a day. Wall push-ups  Stand so you are facing a stable wall. Your feet should be about one arm-length away from the wall. Lean forward and place your palms on the wall at shoulder height. Keep your feet flat on the floor as you bend your elbows and lean forward toward the wall. Hold for __________ seconds. Straighten your elbows to push yourself back to the starting position. Repeat __________ times. Complete this exercise __________ times a day. This information is not intended to replace advice given to you by your health care provider. Make sure you discuss any questions you have with your healthcare provider. Document Revised: 05/05/2018 Document  Reviewed: 02/10/2018 Elsevier Patient Education  2022 [employer]  

## 2020-07-15 ENCOUNTER — Telehealth: Payer: Self-pay

## 2020-07-15 DIAGNOSIS — M25512 Pain in left shoulder: Secondary | ICD-10-CM

## 2020-07-15 NOTE — Telephone Encounter (Signed)
Patient's husband called stating the patient has a cortisone injection in left shoulder last Thursday and got no relief. Patient is now having numbness and pain from her shoulder down to her hand. Patient would like to know if MRI is the next step. Please advise.

## 2020-07-15 NOTE — Telephone Encounter (Signed)
LMAM for patient to call back.

## 2020-07-15 NOTE — Telephone Encounter (Signed)
Patient's husband Westly Pam called stating Sheila Duran got a cortisone injection in her shoulder at her appointment last week and now the pain is going into her forearm and causing numbness in her hand.  Patient is asking if Dr. Corliss Skains can refer her for an MRI.

## 2020-07-16 NOTE — Telephone Encounter (Signed)
I returned patient's call.  She states that she has had no benefit from the cortisone injection she still have severe pain and discomfort in her left shoulder.  She has difficulty raising her arm.  Please a schedule MRI of the left shoulder joint to rule out rotator cuff tear.

## 2020-07-16 NOTE — Telephone Encounter (Signed)
Order for MRI placed

## 2020-07-17 ENCOUNTER — Telehealth: Payer: Self-pay | Admitting: Rheumatology

## 2020-07-17 DIAGNOSIS — M25512 Pain in left shoulder: Secondary | ICD-10-CM

## 2020-07-17 NOTE — Telephone Encounter (Signed)
I spoke with Irma A with [facility]. Request for MRI left shoulder w/o contrast (15041) does not met criteria for approval. Doctor can call [phone] to provide addition information. Please reference Case # [id number].

## 2020-07-17 NOTE — Telephone Encounter (Signed)
Please notify patient that the MRI is not approved by the insurance.  We can refer her to orthopedic surgery for evaluation.

## 2020-07-17 NOTE — Telephone Encounter (Signed)
Patient advised that the MRI is not approved by the insurance.  We can refer her to orthopedic surgery for evaluation. Patient states she has not been seen by an orthopedist previously. Referral placed.

## 2020-08-01 ENCOUNTER — Ambulatory Visit: Payer: BC Managed Care – PPO | Admitting: Orthopedic Surgery

## 2020-08-05 ENCOUNTER — Encounter: Payer: Self-pay | Admitting: Surgical

## 2020-08-05 ENCOUNTER — Ambulatory Visit (INDEPENDENT_AMBULATORY_CARE_PROVIDER_SITE_OTHER): Payer: BC Managed Care – PPO | Admitting: Surgical

## 2020-08-05 ENCOUNTER — Ambulatory Visit: Payer: Self-pay

## 2020-08-05 ENCOUNTER — Other Ambulatory Visit: Payer: Self-pay

## 2020-08-05 DIAGNOSIS — M542 Cervicalgia: Secondary | ICD-10-CM | POA: Diagnosis not present

## 2020-08-05 MED ORDER — PREDNISONE 10 MG (21) PO TBPK: ORAL_TABLET | ORAL | 0 refills | Status: AC

## 2020-08-05 NOTE — Progress Notes (Signed)
Office Visit Note   Patient: Sheila Duran           Date of Birth: 29-Mar-1975           MRN: [id number] Visit Date: 08/05/2020 Requested by: Pollyann Savoy, MD [address] [place],  [place] [address] PCP: Janeece Agee, NP  Subjective: Chief Complaint  Patient presents with   Left Shoulder - Pain    HPI: Sheila Duran is a 45 y.o. female who presents to the office complaining of left shoulder pain.  Patient notes several months of left shoulder pain without any injury.  She complains of lateral left shoulder pain with radiation down the entirety of her left arm.  She complains that the entire arm goes numb completely several times per day.  She is dropping objects due to her arm numbness and feels weak with her grip strength.  She describes sharp pain that shoots down her arm as well as associated neck pain.  This pain initially began as scapular pain but that has now resolved and she is left with this radicular shoulder pain.  She denies any bowel/bladder incontinence, difficulty with sexual intercourse, changes in her gait.  No history of diabetes or smoking.  She has a history of rheumatoid arthritis and sees Dr Corliss Skains who gave her subacromial injection on 07/10/2020 with no relief not even for 1 day.  She has had similar pain in her right arm back in 2018 that she had MRI for at the time and had to see a neurosurgeon for a "slipped disc".  The neurosurgeon recommended surgery at the time but she refused and her right arm symptoms slowly resolved.  She is not currently having any right arm pain or numbness.  She has never had ESI's..                ROS: All systems reviewed are negative as they relate to the chief complaint within the history of present illness.  Patient denies fevers or chills.  Assessment & Plan: Visit Diagnoses:  1. Cervicalgia     Plan: Patient is a 45 year old female who presents complaining of left shoulder pain with right arm radicular pain.   She has had pain for several months without injury.  She does have her entire arm go numb multiple times per day and pain that wakes her up every night throughout the night multiple times.  She is fairly miserable and had no relief at all with left subacromial injection of the shoulder.  Radiographs of the left shoulder are unremarkable but she does have radiographs of the cervical spine taken today that show multilevel degeneration with loss of lordosis.  With her history of right-sided radicular pain with history of slipped disc that was recommended to have surgery by neurosurgeon, as well as the current history of severe radicular pain that is impacting her daily life, plan to order MRI of the cervical spine for further evaluation with epidural steroid injections likely to follow.  No sign of myelopathy today.  She has tried home exercises over the last couple months that she tried with her right shoulder without any relief currently.  Her current symptoms are worse in her right shoulder was years ago.  Follow-Up Instructions: No follow-ups on file.   Orders:  Orders Placed This Encounter  Procedures   XR Cervical Spine 2 or 3 views   MR Cervical Spine w/o contrast   No orders of the defined types were placed in this encounter.  Procedures: No procedures performed   Clinical Data: No additional findings.  Objective: Vital Signs: There were no vitals taken for this visit.  Physical Exam:  Constitutional: Patient appears well-developed HEENT:  Head: Normocephalic Eyes:EOM are normal Neck: Normal range of motion Cardiovascular: Normal rate Pulmonary/chest: Effort normal Neurologic: Patient is alert Skin: Skin is warm Psychiatric: Patient has normal mood and affect  Ortho Exam: Ortho exam demonstrates left shoulder with 40 degrees external rotation, 95 degrees abduction, 160 degrees forward flexion.  Active range of motion equivalent to passive range of motion.  Excellent  rotator cuff strength of supra, infra, subscap.  Mild to moderate tenderness over the bicipital groove with positive O'Brien sign.  No tenderness over the [facility] joint.  Positive tenderness over the axial cervical spine with positive Spurling sign that causes shooting pain and numbness down the entirety of her left arm into her fingertips.  5/5 motor strength of bilateral grip strength, finger abduction, pronation/supination, bicep, tricep, deltoid.  Normoreflexic with 2+ reflexes of the bicep on the right with 1+ reflex of the bicep on the left.  Decree sensation throughout the left arm in all dermatomes.  Negative Hoffmann sign.  Specialty Comments:  No specialty comments available.  Imaging: No results found.   PMFS History: Patient Active Problem List   Diagnosis Date Noted   History of migraine 01/31/2020   Essential hypertension 01/31/2020   Chronic pain syndrome 01/31/2020   Past Medical History:  Diagnosis Date   Hypertension    Sjogren's syndrome (HCC)     Family History  Problem Relation Age of Onset   Diabetes Mother    Hypertension Mother    Kidney failure Mother    Hypertension Father    Healthy Sister    Healthy Brother    Healthy Daughter    Healthy Son    Healthy Sister    Healthy Son     Past Surgical History:  Procedure Laterality Date   ABDOMINAL HYSTERECTOMY     CESAREAN SECTION     Social History   Occupational History   Not on file  Tobacco Use   Smoking status: Never   Smokeless tobacco: Never  Vaping Use   Vaping Use: Never used  Substance and Sexual Activity   Alcohol use: Yes    Comment: rare   Drug use: Never   Sexual activity: Yes

## 2020-08-14 ENCOUNTER — Ambulatory Visit
Admission: RE | Admit: 2020-08-14 | Discharge: 2020-08-14 | Disposition: A | Discharge status: Home / Self Care | Payer: BC Managed Care – PPO | Source: Ambulatory Visit | Attending: Surgical | Admitting: Surgical

## 2020-08-14 ENCOUNTER — Other Ambulatory Visit: Payer: Self-pay

## 2020-08-14 DIAGNOSIS — M4802 Spinal stenosis, cervical region: Secondary | ICD-10-CM | POA: Diagnosis not present

## 2020-08-14 DIAGNOSIS — M542 Cervicalgia: Secondary | ICD-10-CM

## 2020-08-16 ENCOUNTER — Other Ambulatory Visit: Payer: BC Managed Care – PPO

## 2020-08-17 ENCOUNTER — Other Ambulatory Visit: Payer: BC Managed Care – PPO

## 2020-08-27 ENCOUNTER — Other Ambulatory Visit: Payer: Self-pay

## 2020-08-27 ENCOUNTER — Encounter: Payer: Self-pay | Admitting: Orthopedic Surgery

## 2020-08-27 ENCOUNTER — Ambulatory Visit: Payer: BC Managed Care – PPO | Admitting: Orthopedic Surgery

## 2020-08-27 DIAGNOSIS — M542 Cervicalgia: Secondary | ICD-10-CM

## 2020-08-28 ENCOUNTER — Encounter: Payer: Self-pay | Admitting: Orthopedic Surgery

## 2020-08-28 NOTE — Progress Notes (Signed)
Office Visit Note   Patient: Sheila Duran           Date of Birth: 07/16/1975           MRN: [id number] Visit Date: 08/27/2020 Requested by: Janeece Agee, NP [address] [place],  [place] [address] PCP: Janeece Agee, NP  Subjective: Chief Complaint  Patient presents with   Neck - Follow-up    HPI: Patient presents for follow-up of cervical spine MRI.  Rates pain in the neck and left arm is 6 out of 10.  She describes night pain and rest pain.  Prednisone Dosepak helped for a week but her symptoms have recurred.  She does report weakness in the arm.  Denies any mechanical symptoms in the shoulders.  No prior surgery on the neck or shoulder.  Reports pain in the left arm but no right arm symptoms.  MRI scan is reviewed with the patient and it shows severe left sided foraminal encroachment at C5-6.  Images are reviewed with the patient.              ROS: All systems reviewed are negative as they relate to the chief complaint within the history of present illness.  Patient denies  fevers or chills.   Assessment & Plan: Visit Diagnoses:  1. Cervicalgia     Plan: Impression is left-sided radiculopathy confirmed with C-spine MRI.  Patient has failed conservative management.  Plan to refer her to Dr. Alvester Morin for cervical spine ESI.  If that does not help she may need to be referred to neck surgeon for further evaluation and management.  Follow-Up Instructions: No follow-ups on file.   Orders:  Orders Placed This Encounter  Procedures   Ambulatory referral to Physical Medicine Rehab   No orders of the defined types were placed in this encounter.     Procedures: No procedures performed   Clinical Data: No additional findings.  Objective: Vital Signs: There were no vitals taken for this visit.  Physical Exam:   Constitutional: Patient appears well-developed HEENT:  Head: Normocephalic Eyes:EOM are normal Neck: Normal range of motion Cardiovascular: Normal  rate Pulmonary/chest: Effort normal Neurologic: Patient is alert Skin: Skin is warm Psychiatric: Patient has normal mood and affect   Ortho Exam: Ortho exam demonstrates no muscle atrophy left arm versus right arm.  Still has reasonable grip EPL FPL interosseous are/extension bicep triceps and deltoid strength with no definite paresthesias C5-T1.  Does have pain with range of motion and rotation to the left compared to the right.  No restriction of passive range of motion of the shoulder.  Rotator cuff strength intact.  Specialty Comments:  No specialty comments available.  Imaging: No results found.   PMFS History: Patient Active Problem List   Diagnosis Date Noted   History of migraine 01/31/2020   Essential hypertension 01/31/2020   Chronic pain syndrome 01/31/2020   Past Medical History:  Diagnosis Date   Hypertension    Sjogren's syndrome (HCC)     Family History  Problem Relation Age of Onset   Diabetes Mother    Hypertension Mother    Kidney failure Mother    Hypertension Father    Healthy Sister    Healthy Brother    Healthy Daughter    Healthy Son    Healthy Sister    Healthy Son     Past Surgical History:  Procedure Laterality Date   ABDOMINAL HYSTERECTOMY     CESAREAN SECTION  Social History   Occupational History   Not on file  Tobacco Use   Smoking status: Never   Smokeless tobacco: Never  Vaping Use   Vaping Use: Never used  Substance and Sexual Activity   Alcohol use: Yes    Comment: rare   Drug use: Never   Sexual activity: Yes

## 2020-09-02 ENCOUNTER — Other Ambulatory Visit: Payer: Self-pay | Admitting: Orthopedic Surgery

## 2020-09-02 ENCOUNTER — Ambulatory Visit
Admission: RE | Admit: 2020-09-02 | Discharge: 2020-09-02 | Disposition: A | Discharge status: Home / Self Care | Payer: BC Managed Care – PPO | Source: Ambulatory Visit | Attending: Orthopedic Surgery | Admitting: Orthopedic Surgery

## 2020-09-02 DIAGNOSIS — M47812 Spondylosis without myelopathy or radiculopathy, cervical region: Secondary | ICD-10-CM | POA: Diagnosis not present

## 2020-09-02 DIAGNOSIS — M542 Cervicalgia: Secondary | ICD-10-CM

## 2020-09-02 MED ORDER — TRIAMCINOLONE ACETONIDE 40 MG/ML IJ SUSP (RADIOLOGY)
60.0000 mg | Freq: Once | INTRAMUSCULAR | Status: AC
  Administered 2020-09-02: 60 mg via EPIDURAL

## 2020-09-02 MED ORDER — IOPAMIDOL (ISOVUE-M 300) INJECTION 61%
1.0000 mL | Freq: Once | INTRAMUSCULAR | Status: AC
  Administered 2020-09-02: 1 mL via EPIDURAL

## 2020-09-02 NOTE — Discharge Instructions (Signed)

## 2020-09-26 NOTE — Progress Notes (Deleted)
Office Visit Note  Patient: Sheila Duran             Date of Birth: 1975/03/05           MRN: [id number]             PCP: Janeece Agee, NP Referring: Janeece Agee, NP Visit Date: 10/10/2020 Occupation: @GUAROCC @  Subjective:    History of Present Illness: Jessye Imhoff is a 45 y.o. female with history of Sjogren's syndrome.  Left shoulder injected with cortisone on 07/10/20   Activities of Daily Living:  Patient reports morning stiffness for *** {minute/hour:19697}.   Patient {ACTIONS;DENIES/REPORTS:21021675::"Denies"} nocturnal pain.  Difficulty dressing/grooming: {ACTIONS;DENIES/REPORTS:21021675::"Denies"} Difficulty climbing stairs: {ACTIONS;DENIES/REPORTS:21021675::"Denies"} Difficulty getting out of chair: {ACTIONS;DENIES/REPORTS:21021675::"Denies"} Difficulty using hands for taps, buttons, cutlery, and/or writing: {ACTIONS;DENIES/REPORTS:21021675::"Denies"}  No Rheumatology ROS completed.   PMFS History:  Patient Active Problem List   Diagnosis Date Noted   History of migraine 01/31/2020   Essential hypertension 01/31/2020   Chronic pain syndrome 01/31/2020    Past Medical History:  Diagnosis Date   Hypertension    Sjogren's syndrome (HCC)     Family History  Problem Relation Age of Onset   Diabetes Mother    Hypertension Mother    Kidney failure Mother    Hypertension Father    Healthy Sister    Healthy Brother    Healthy Daughter    Healthy Son    Healthy Sister    Healthy Son    Past Surgical History:  Procedure Laterality Date   ABDOMINAL HYSTERECTOMY     CESAREAN SECTION     Social History   Social History Narrative   Not on file   Immunization History  Administered Date(s) Administered   Moderna Sars-Covid-2 Vaccination 03/02/2019, 03/30/2019, 01/14/2020     Objective: Vital Signs: There were no vitals taken for this visit.   Physical Exam Vitals and nursing note reviewed.  Constitutional:      Appearance: She is  well-developed.  HENT:     Head: Normocephalic and atraumatic.  Eyes:     Conjunctiva/sclera: Conjunctivae normal.  Pulmonary:     Effort: Pulmonary effort is normal.  Abdominal:     Palpations: Abdomen is soft.  Musculoskeletal:     Cervical back: Normal range of motion.  Lymphadenopathy:     Cervical: No cervical adenopathy.  Skin:    General: Skin is warm and dry.     Capillary Refill: Capillary refill takes less than 2 seconds.  Neurological:     Mental Status: She is alert and oriented to person, place, and time.  Psychiatric:        Behavior: Behavior normal.     Musculoskeletal Exam: ***  CDAI Exam: CDAI Score: -- Patient Global: --; Provider Global: -- Swollen: --; Tender: -- Joint Exam 10/10/2020   No joint exam has been documented for this visit   There is currently no information documented on the homunculus. Go to the Rheumatology activity and complete the homunculus joint exam.  Investigation: No additional findings.  Imaging: DG INJECT DIAG/THERA/INC NEEDLE/CATH/PLC EPI/CERV/THOR W/IMG  Result Date: 09/02/2020 CLINICAL DATA:  Spondylosis without myelopathy. Neck and left arm pain. Cervicalgia. FLUOROSCOPY TIME:  0 minutes 46 seconds. 19.12 micro gray meter squared PROCEDURE: CERVICAL EPIDURAL INJECTION An interlaminar approach was performed on the left at C6-7. A 20 gauge epidural needle was advanced using loss-of-resistance technique. DIAGNOSTIC EPIDURAL INJECTION Injection of Isovue-M 300 shows a good epidural pattern with spread above and below the level of needle placement,  primarily on the left, but to both sides. No vascular opacification is seen. THERAPEUTIC EPIDURAL INJECTION 1.5 ml of Kenalog 40 mixed with 1 ml of 1% Lidocaine and 2 ml of normal saline were then instilled. The procedure was well-tolerated, and the patient was discharged thirty minutes following the injection in good condition. IMPRESSION: Technically successful initial epidural injection  on the left at C6-7. Electronically Signed   By: Paulina Fusi M.D.   On: 09/02/2020 13:46    Recent Labs: Lab Results  Component Value Date   WBC 6.1 06/03/2020   HGB 13.3 06/03/2020   PLT 261.0 06/03/2020   NA 139 06/03/2020   K 3.7 06/03/2020   CL 105 06/03/2020   CO2 25 06/03/2020   GLUCOSE 115 (H) 06/03/2020   BUN 12 06/03/2020   CREATININE 0.86 06/03/2020   BILITOT 0.6 06/03/2020   ALKPHOS 53 06/03/2020   AST 32 06/03/2020   ALT 53 (H) 06/03/2020   PROT 7.3 06/03/2020   ALBUMIN 4.1 06/03/2020   CALCIUM 8.9 06/03/2020   GFRAA 86 01/31/2020    Speciality Comments: No specialty comments available.  Procedures:  No procedures performed Allergies: Lisinopril-hydrochlorothiazide and Amoxicillin   Assessment / Plan:     Visit Diagnoses: Sjogren's syndrome with keratoconjunctivitis sicca (HCC) - Repeat ANA negative, anti-Ro Ab > 8.0.  Patient gives history of dry mouth and dry eyes  Chronic right SI joint pain  Chronic pain syndrome  Essential hypertension  Vitamin D deficiency  History of migraine  Elevated LFTs  Orders: No orders of the defined types were placed in this encounter.  No orders of the defined types were placed in this encounter.    Follow-Up Instructions: No follow-ups on file.   Gearldine Bienenstock, PA-C  Note - This record has been created using Dragon software.  Chart creation errors have been sought, but may not always  have been located. Such creation errors do not reflect on  the standard of medical care.

## 2020-10-10 ENCOUNTER — Ambulatory Visit: Payer: BC Managed Care – PPO | Admitting: Physician Assistant

## 2020-10-10 DIAGNOSIS — M3501 Sicca syndrome with keratoconjunctivitis: Secondary | ICD-10-CM

## 2020-10-10 DIAGNOSIS — E559 Vitamin D deficiency, unspecified: Secondary | ICD-10-CM

## 2020-10-10 DIAGNOSIS — Z8669 Personal history of other diseases of the nervous system and sense organs: Secondary | ICD-10-CM

## 2020-10-10 DIAGNOSIS — G894 Chronic pain syndrome: Secondary | ICD-10-CM

## 2020-10-10 DIAGNOSIS — G8929 Other chronic pain: Secondary | ICD-10-CM

## 2020-10-10 DIAGNOSIS — I10 Essential (primary) hypertension: Secondary | ICD-10-CM

## 2020-10-10 DIAGNOSIS — R7989 Other specified abnormal findings of blood chemistry: Secondary | ICD-10-CM

## 2020-11-15 NOTE — Progress Notes (Signed)
Established Patient Office Visit  Subjective:  Patient ID: Sheila Duran, female    DOB: 1975-03-13  Age: 45 y.o. MRN: [id number]  CC:  Chief Complaint  Patient presents with   Annual Exam    Patient states she is here for a physical.Patient has no other concerns.    HPI [name] presents for CPE   No acute concerns  Histories reviewed and updated with patient.   Htn - uncontrolled, managed by cardiology.   Sjogren's - managed by rheum  Past Medical History:  Diagnosis Date   Hypertension    Sjogren's syndrome (HCC)     Past Surgical History:  Procedure Laterality Date   ABDOMINAL HYSTERECTOMY     CESAREAN SECTION      Family History  Problem Relation Age of Onset   Diabetes Mother    Hypertension Mother    Kidney failure Mother    Hypertension Father    Healthy Sister    Healthy Brother    Healthy Daughter    Healthy Son    Healthy Sister    Healthy Son     Social History   Socioeconomic History   Marital status: Married    Spouse name: Not on file   Number of children: 3   Years of education: Not on file   Highest education level: Not on file  Occupational History   Not on file  Tobacco Use   Smoking status: Never   Smokeless tobacco: Never  Vaping Use   Vaping Use: Never used  Substance and Sexual Activity   Alcohol use: Yes    Comment: rare   Drug use: Never   Sexual activity: Yes  Other Topics Concern   Not on file  Social History Narrative   Not on file   Social Determinants of Health   Financial Resource Strain: Not on file  Food Insecurity: Not on file  Transportation Needs: Not on file  Physical Activity: Not on file  Stress: Not on file  Social Connections: Not on file  Intimate Partner Violence: Not on file    Outpatient Medications Prior to Visit  Medication Sig Dispense Refill   amLODipine (NORVASC) 10 MG tablet Take 1 tablet (10 mg total) by mouth daily. 90 tablet 3   gabapentin (NEURONTIN) 300 MG capsule TAKE  1 CAPSULE BY MOUTH THREE TIMES A DAY 270 capsule 1   losartan (COZAAR) 100 MG tablet TAKE 1 TABLET BY MOUTH EVERY DAY 90 tablet 1   metoprolol succinate (TOPROL-XL) 50 MG 24 hr tablet Take 1 tablet (50 mg total) by mouth daily. Take with or immediately following a meal. 90 tablet 3   spironolactone (ALDACTONE) 100 MG tablet Take 100 mg by mouth daily.     topiramate (TOPAMAX) 100 MG tablet TAKE 1 TABLET BY MOUTH TWICE A DAY 60 tablet 1   No facility-administered medications prior to visit.    Allergies  Allergen Reactions   Lisinopril-Hydrochlorothiazide Cough    And chest pain    Amoxicillin Rash    ROS Review of Systems  Constitutional: Negative.   HENT: Negative.    Eyes: Negative.   Respiratory: Negative.    Cardiovascular: Negative.   Gastrointestinal: Negative.   Genitourinary: Negative.   Musculoskeletal: Negative.   Skin: Negative.   Neurological: Negative.   Psychiatric/Behavioral: Negative.    All other systems reviewed and are negative.    Objective:    Physical Exam Vitals and nursing note reviewed.  Constitutional:  General: She is not in acute distress.    Appearance: Normal appearance. She is normal weight. She is not ill-appearing, toxic-appearing or diaphoretic.  HENT:     Head: Normocephalic and atraumatic.     Right Ear: Tympanic membrane, ear canal and external ear normal. There is no impacted cerumen.     Left Ear: Tympanic membrane, ear canal and external ear normal. There is no impacted cerumen.     Nose: Nose normal. No congestion or rhinorrhea.     Mouth/Throat:     Mouth: Mucous membranes are moist.     Pharynx: Oropharynx is clear. No oropharyngeal exudate or posterior oropharyngeal erythema.  Eyes:     General: No scleral icterus.       Right eye: No discharge.        Left eye: No discharge.     Extraocular Movements: Extraocular movements intact.     Conjunctiva/sclera: Conjunctivae normal.     Pupils: Pupils are equal, round, and  reactive to light.  Cardiovascular:     Rate and Rhythm: Normal rate and regular rhythm.     Pulses: Normal pulses.     Heart sounds: Normal heart sounds. No murmur heard.   No friction rub. No gallop.  Pulmonary:     Effort: Pulmonary effort is normal. No respiratory distress.     Breath sounds: Normal breath sounds. No stridor. No wheezing, rhonchi or rales.  Chest:     Chest wall: No tenderness.  Abdominal:     General: Abdomen is flat. Bowel sounds are normal. There is no distension.     Palpations: Abdomen is soft. There is no mass.     Tenderness: There is no abdominal tenderness. There is no right CVA tenderness, left CVA tenderness, guarding or rebound.     Hernia: No hernia is present.  Musculoskeletal:        General: No swelling, tenderness, deformity or signs of injury. Normal range of motion.     Right lower leg: No edema.     Left lower leg: No edema.  Skin:    General: Skin is warm and dry.     Capillary Refill: Capillary refill takes less than 2 seconds.     Coloration: Skin is not jaundiced or pale.     Findings: No bruising, erythema, lesion or rash.  Neurological:     General: No focal deficit present.     Mental Status: She is alert and oriented to person, place, and time. Mental status is at baseline.     Cranial Nerves: No cranial nerve deficit.     Sensory: No sensory deficit.     Motor: No weakness.     Coordination: Coordination normal.     Gait: Gait normal.     Deep Tendon Reflexes: Reflexes normal.  Psychiatric:        Mood and Affect: Mood normal.        Behavior: Behavior normal.        Thought Content: Thought content normal.        Judgment: Judgment normal.    BP (!) 180/124   Pulse 91   Temp 98 F (36.7 C) (Temporal)   Resp 16   Ht 5' 4.5" (1.638 m)   Wt 248 lb 9.6 oz (112.8 kg)   SpO2 97%   BMI 42.01 kg/m  Wt Readings from Last 3 Encounters:  07/10/20 241 lb (109.3 kg)  06/03/20 248 lb 9.6 oz (112.8 kg)  02/28/20 249 lb 9.6 oz  (  113.2 kg)     Health Maintenance Due  Topic Date Due   HIV Screening  Never done   Hepatitis C Screening  Never done   TETANUS/TDAP  Never done   COVID-19 Vaccine (4 - Booster for Moderna series) 03/10/2020   COLONOSCOPY (Pts 45-2yrs Insurance coverage will need to be confirmed)  Never done   INFLUENZA VACCINE  Never done    There are no preventive care reminders to display for this patient.  Lab Results  Component Value Date   TSH 0.68 06/03/2020   Lab Results  Component Value Date   WBC 6.1 06/03/2020   HGB 13.3 06/03/2020   HCT 39.6 06/03/2020   MCV 87.6 06/03/2020   PLT 261.0 06/03/2020   Lab Results  Component Value Date   NA 139 06/03/2020   K 3.7 06/03/2020   CO2 25 06/03/2020   GLUCOSE 115 (H) 06/03/2020   BUN 12 06/03/2020   CREATININE 0.86 06/03/2020   BILITOT 0.6 06/03/2020   ALKPHOS 53 06/03/2020   AST 32 06/03/2020   ALT 53 (H) 06/03/2020   PROT 7.3 06/03/2020   ALBUMIN 4.1 06/03/2020   CALCIUM 8.9 06/03/2020   GFR 81.94 06/03/2020   Lab Results  Component Value Date   CHOL 170 06/03/2020   Lab Results  Component Value Date   HDL 41.80 06/03/2020   Lab Results  Component Value Date   LDLCALC 91 06/03/2020   Lab Results  Component Value Date   TRIG 186.0 (H) 06/03/2020   Lab Results  Component Value Date   CHOLHDL 4 06/03/2020   Lab Results  Component Value Date   HGBA1C 5.9 (A) 06/03/2020      Assessment & Plan:   Problem List Items Addressed This Visit       Cardiovascular and Mediastinum   Essential hypertension   Relevant Orders   CBC with Differential/Platelet (Completed)   Lipid panel (Completed)   Comprehensive metabolic panel (Completed)   POCT HgB A1C (Completed)   Other Visit Diagnoses     Annual physical exam    -  Primary   Screening for endocrine, metabolic and immunity disorder       Relevant Orders   CBC with Differential/Platelet (Completed)   TSH (Completed)   Comprehensive metabolic panel  (Completed)   POCT HgB A1C (Completed)   Lipid screening       Relevant Orders   Lipid panel (Completed)       No orders of the defined types were placed in this encounter.   Follow-up: No follow-ups on file.   PLAN Exam unremarkable Labs collected. Will follow up with the patient as warranted. Return in 6 mo to follow up on chronic conditions Patient encouraged to call clinic with any questions, comments, or concerns.  Janeece Agee, NP

## 2021-03-14 NOTE — Progress Notes (Signed)
Established Patient Office Visit  Subjective:  Patient ID: Sheila Duran, female    DOB: March 03, 1975  Age: 46 y.o. MRN: [id number]  CC:  Chief Complaint  Patient presents with   lab result follow up     Along with BP f/u    Hypertension    F/u    Dizziness    HPI Sheila Duran presents for follow up   Hypertension: Patient Currently taking: metoprolol 50mg  XR po qd, spironolactone 100mg  po qd, amlodipine 10mg p po qd Good effect. No AEs. Denies CV symptoms including: chest pain, shob, doe, headache, visual changes, fatigue, claudication, and dependent edema.   Previous readings and labs: BP Readings from Last 3 Encounters:  09/02/20 (!) 203/121  07/10/20 (!) 157/98  06/03/20 (!) 180/124   Lab Results  Component Value Date   CREATININE 0.86 06/03/2020   Labs are steady. Discussed risk of HTN uncontrolled. She has no symptoms, fortunately.  If not under control soon, will have to defer to htn clinic.   Past Medical History:  Diagnosis Date   Hypertension    Sjogren's syndrome (HCC)     Past Surgical History:  Procedure Laterality Date   ABDOMINAL HYSTERECTOMY     CESAREAN SECTION      Family History  Problem Relation Age of Onset   Diabetes Mother    Hypertension Mother    Kidney failure Mother    Hypertension Father    Healthy Sister    Healthy Brother    Healthy Daughter    Healthy Son    Healthy Sister    Healthy Son     Social History   Socioeconomic History   Marital status: Married    Spouse name: Not on file   Number of children: 3   Years of education: Not on file   Highest education level: Not on file  Occupational History   Not on file  Tobacco Use   Smoking status: Never   Smokeless tobacco: Never  Vaping Use   Vaping Use: Never used  Substance and Sexual Activity   Alcohol use: Yes    Comment: rare   Drug use: Never   Sexual activity: Yes  Other Topics Concern   Not on file  Social History Narrative   Not on file   Social  Determinants of Health   Financial Resource Strain: Not on file  Food Insecurity: Not on file  Transportation Needs: Not on file  Physical Activity: Not on file  Stress: Not on file  Social Connections: Not on file  Intimate Partner Violence: Not on file    Outpatient Medications Prior to Visit  Medication Sig Dispense Refill   gabapentin (NEURONTIN) 300 MG capsule TAKE 1 CAPSULE BY MOUTH THREE TIMES A DAY 270 capsule 1   metoprolol succinate (TOPROL-XL) 50 MG 24 hr tablet Take 1 tablet (50 mg total) by mouth daily. Take with or immediately following a meal. 90 tablet 3   spironolactone (ALDACTONE) 100 MG tablet Take 100 mg by mouth daily.     amLODipine (NORVASC) 5 MG tablet TAKE 1 TABLET BY MOUTH EVERY DAY 30 tablet 2   losartan (COZAAR) 100 MG tablet TAKE 1 TABLET BY MOUTH EVERY DAY 30 tablet 2   topiramate (TOPAMAX) 100 MG tablet TAKE 1 TABLET BY MOUTH TWICE A DAY 60 tablet 2   cyclobenzaprine (FLEXERIL) 10 MG tablet Take 1 tablet (10 mg total) by mouth 3 (three) times daily as needed for muscle spasms. (Patient not taking: Reported on  02/01/2020) 90 tablet 0   nabumetone (RELAFEN) 500 MG tablet Take 500 mg by mouth daily. (Patient not taking: Reported on 02/01/2020)     nebivolol (BYSTOLIC) 10 MG tablet Take 1 tablet (10 mg total) by mouth daily. (Patient not taking: Reported on 02/01/2020) 90 tablet 0   Vitamin D, Ergocalciferol, (DRISDOL) 1.25 MG (50000 UNIT) CAPS capsule TAKE 1 CAPSULE (50,000 UNITS TOTAL) BY MOUTH EVERY 7 (SEVEN) DAYS. (Patient not taking: Reported on 02/01/2020) 4 capsule 1   No facility-administered medications prior to visit.    Allergies  Allergen Reactions   Lisinopril-Hydrochlorothiazide Cough    And chest pain    Amoxicillin Rash    ROS Review of Systems  Constitutional: Negative.   HENT: Negative.    Eyes: Negative.   Respiratory: Negative.    Cardiovascular: Negative.   Gastrointestinal: Negative.   Genitourinary: Negative.   Musculoskeletal:  Negative.   Skin: Negative.   Neurological: Negative.   Psychiatric/Behavioral: Negative.    All other systems reviewed and are negative.    Objective:    Physical Exam Vitals and nursing note reviewed.  Constitutional:      General: She is not in acute distress.    Appearance: Normal appearance. She is normal weight. She is not ill-appearing, toxic-appearing or diaphoretic.  Cardiovascular:     Rate and Rhythm: Normal rate and regular rhythm.     Heart sounds: Normal heart sounds. No murmur heard.   No friction rub. No gallop.  Pulmonary:     Effort: Pulmonary effort is normal. No respiratory distress.     Breath sounds: Normal breath sounds. No stridor. No wheezing, rhonchi or rales.  Chest:     Chest wall: No tenderness.  Skin:    General: Skin is warm and dry.  Neurological:     General: No focal deficit present.     Mental Status: She is alert and oriented to person, place, and time. Mental status is at baseline.  Psychiatric:        Mood and Affect: Mood normal.        Behavior: Behavior normal.        Thought Content: Thought content normal.        Judgment: Judgment normal.    BP (!) 170/100    Pulse 76    Temp 98.1 F (36.7 C) (Temporal)    Ht 5\' 5"  (1.651 m)    Wt 246 lb 12.8 oz (111.9 kg)    SpO2 97%    BMI 41.07 kg/m  Wt Readings from Last 3 Encounters:  07/10/20 241 lb (109.3 kg)  06/03/20 248 lb 9.6 oz (112.8 kg)  02/28/20 249 lb 9.6 oz (113.2 kg)     Health Maintenance Due  Topic Date Due   HIV Screening  Never done   Hepatitis C Screening  Never done   TETANUS/TDAP  Never done   COVID-19 Vaccine (4 - Booster for Moderna series) 03/10/2020   COLONOSCOPY (Pts 45-27yrs Insurance coverage will need to be confirmed)  Never done   INFLUENZA VACCINE  Never done    There are no preventive care reminders to display for this patient.  Lab Results  Component Value Date   TSH 0.68 06/03/2020   Lab Results  Component Value Date   WBC 6.1 06/03/2020    HGB 13.3 06/03/2020   HCT 39.6 06/03/2020   MCV 87.6 06/03/2020   PLT 261.0 06/03/2020   Lab Results  Component Value Date   NA 139 06/03/2020  K 3.7 06/03/2020   CO2 25 06/03/2020   GLUCOSE 115 (H) 06/03/2020   BUN 12 06/03/2020   CREATININE 0.86 06/03/2020   BILITOT 0.6 06/03/2020   ALKPHOS 53 06/03/2020   AST 32 06/03/2020   ALT 53 (H) 06/03/2020   PROT 7.3 06/03/2020   ALBUMIN 4.1 06/03/2020   CALCIUM 8.9 06/03/2020   GFR 81.94 06/03/2020   Lab Results  Component Value Date   CHOL 170 06/03/2020   Lab Results  Component Value Date   HDL 41.80 06/03/2020   Lab Results  Component Value Date   LDLCALC 91 06/03/2020   Lab Results  Component Value Date   TRIG 186.0 (H) 06/03/2020   Lab Results  Component Value Date   CHOLHDL 4 06/03/2020   Lab Results  Component Value Date   HGBA1C 5.9 (A) 06/03/2020      Assessment & Plan:   Problem List Items Addressed This Visit       Cardiovascular and Mediastinum   Essential hypertension   Relevant Medications   amLODipine (NORVASC) 10 MG tablet   Other Visit Diagnoses     Cold sweat    -  Primary   Relevant Orders   EKG 12-Lead (Completed)   Insulin and C-Peptide (Completed)   Thyroid Panel With TSH (Completed)   CBC With Differential (Completed)   Palpitations       Relevant Orders   EKG 12-Lead (Completed)   Thyroid Panel With TSH (Completed)       Meds ordered this encounter  Medications   amLODipine (NORVASC) 10 MG tablet    Sig: Take 1 tablet (10 mg total) by mouth daily.    Dispense:  90 tablet    Refill:  3    Order Specific Question:   Supervising Provider    Answer:   Neva Seat, JEFFREY R [[id number]]    Follow-up: No follow-ups on file.   PLAN Ekg collected, no comparison available. No acute concerns.  Add amlodipine 10mg  po qd Return in 2 weeks for recheck Patient encouraged to call clinic with any questions, comments, or concerns.    Janeece Agee, NP

## 2023-06-01 IMAGING — XA DG INJECT/[PERSON_NAME] INC NEEDLE/CATH/PLC EPI/CERV/THOR W/IMG
2 series · 2 of 2 positions shown · non-contrast
Comparison: none

CLINICAL DATA: Spondylosis without myelopathy. Neck and left arm
pain. Cervicalgia.

[Series 1: vasc adipose · 1 of 1 slices shown (1 of 2)]
[im 1/1]
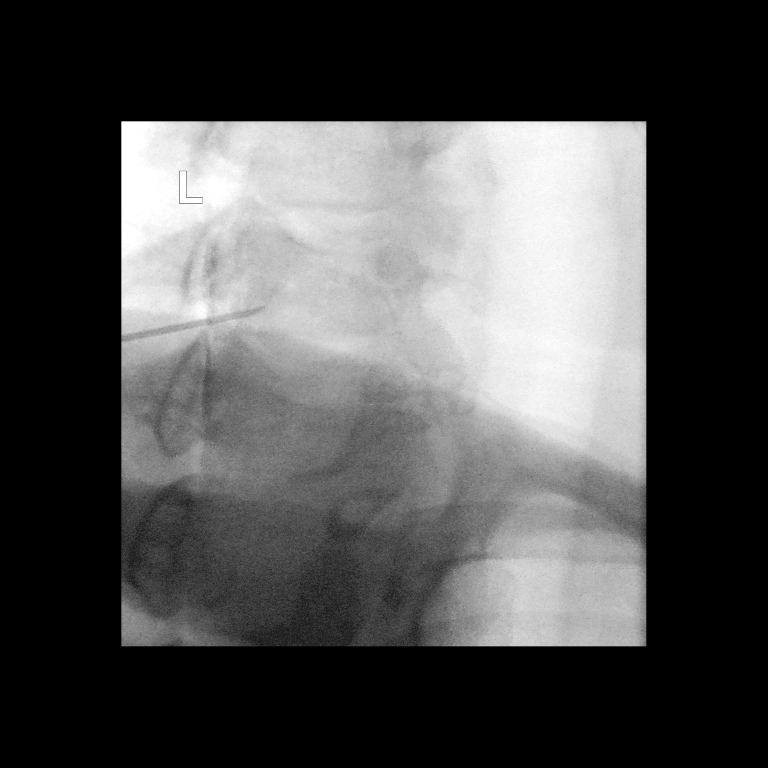

[Series 2: vasc adipose · 1 of 1 slices shown (2 of 2)]
[im 1/1]
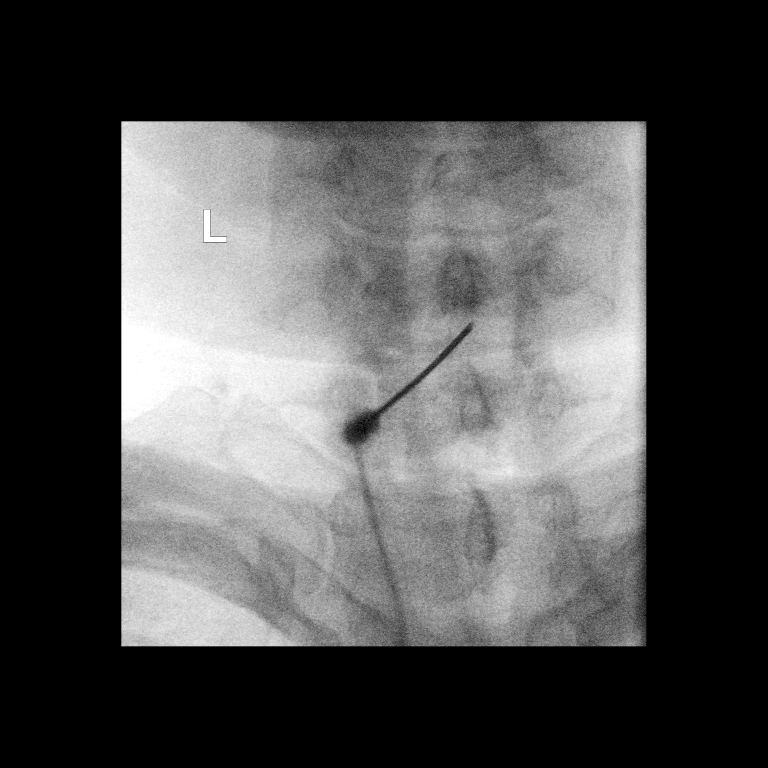

[2 of 2 positions shown; findings below may reference images not displayed]

FLUOROSCOPY TIME:  0 minutes 46 seconds. 19.12 micro gray meter
squared

PROCEDURE:
CERVICAL EPIDURAL INJECTION

An interlaminar approach was performed on the left at C6-7. A 20
gauge epidural needle was advanced using loss-of-resistance
technique.

DIAGNOSTIC EPIDURAL INJECTION

Injection of Isovue-M 300 shows a good epidural pattern with spread
above and below the level of needle placement, primarily on the
left, but to both sides. No vascular opacification is seen.
THERAPEUTIC

EPIDURAL INJECTION

1.5 ml of Kenalog 40 mixed with 1 ml of 1% Lidocaine and 2 ml of
normal saline were then instilled. The procedure was well-tolerated,
and the patient was discharged thirty minutes following the
injection in good condition.
IMPRESSION: Technically successful initial epidural injection on the left at
C6-7.
